# Patient Record
Sex: Male | Born: 1990
Health system: Southern US, Community
[De-identification: ages and names within clinical notes are randomized; demographics above are authoritative.]

## PROBLEM LIST (undated history)

## (undated) DIAGNOSIS — J302 Other seasonal allergic rhinitis: Secondary | ICD-10-CM

## (undated) DIAGNOSIS — E785 Hyperlipidemia, unspecified: Secondary | ICD-10-CM

## (undated) HISTORY — DX: Hyperlipidemia, unspecified: E78.5

---

## 2011-09-14 ENCOUNTER — Emergency Department (HOSPITAL_COMMUNITY): Payer: No Typology Code available for payment source

## 2011-09-14 ENCOUNTER — Emergency Department (HOSPITAL_COMMUNITY)
Admission: EM | Admit: 2011-09-14 | Discharge: 2011-09-14 | Disposition: A | Discharge status: Home / Self Care | Payer: No Typology Code available for payment source | Attending: Emergency Medicine | Admitting: Emergency Medicine

## 2011-09-14 ENCOUNTER — Encounter (HOSPITAL_COMMUNITY): Payer: Self-pay | Admitting: *Deleted

## 2011-09-14 DIAGNOSIS — Z880 Allergy status to penicillin: Secondary | ICD-10-CM | POA: Insufficient documentation

## 2011-09-14 DIAGNOSIS — M25519 Pain in unspecified shoulder: Secondary | ICD-10-CM | POA: Insufficient documentation

## 2011-09-14 DIAGNOSIS — S46912A Strain of unspecified muscle, fascia and tendon at shoulder and upper arm level, left arm, initial encounter: Secondary | ICD-10-CM
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
## 2011-09-17 NOTE — ED Provider Notes (Signed)
Medical screening examination/treatment/procedure(s) were performed by non-physician practitioner and as supervising physician I was immediately available for consultation/collaboration.  Flint Melter, MD 09/17/11 Paulo Fruit

## 2012-04-18 ENCOUNTER — Encounter (HOSPITAL_COMMUNITY): Payer: Self-pay | Admitting: Emergency Medicine

## 2012-04-18 ENCOUNTER — Emergency Department (HOSPITAL_COMMUNITY)
Admission: EM | Admit: 2012-04-18 | Discharge: 2012-04-18 | Disposition: A | Discharge status: Home / Self Care | Payer: BC Managed Care – PPO | Attending: Emergency Medicine | Admitting: Emergency Medicine

## 2012-04-18 DIAGNOSIS — R209 Unspecified disturbances of skin sensation: Secondary | ICD-10-CM | POA: Insufficient documentation

## 2012-04-18 DIAGNOSIS — R002 Palpitations: Secondary | ICD-10-CM

## 2012-04-18 DIAGNOSIS — M79609 Pain in unspecified limb: Secondary | ICD-10-CM | POA: Insufficient documentation

## 2012-04-18 DIAGNOSIS — M79603 Pain in arm, unspecified: Secondary | ICD-10-CM

## 2012-04-18 DIAGNOSIS — Z79899 Other long term (current) drug therapy: Secondary | ICD-10-CM | POA: Insufficient documentation

## 2012-04-18 DIAGNOSIS — R000 Tachycardia, unspecified: Secondary | ICD-10-CM | POA: Insufficient documentation

## 2012-04-18 HISTORY — DX: Other seasonal allergic rhinitis: J30.2

## 2012-04-18 NOTE — ED Provider Notes (Signed)
History     CSN: [id number]  Arrival date & time 04/18/12  4098   First MD Initiated Contact with Patient 04/18/12 [phone]      Chief Complaint  Patient presents with  . Tachycardia    tingling in arms    (Consider location/radiation/quality/duration/timing/severity/associated sxs/prior treatment) HPI Clayton Fry is a 22 y.o. male who presents to the Emergency Department complaining of palpitations and left arm numbness and aching. He was unable to sleep and had numbness to his left arm. After a while, he developed chest palpitations. He denies chest pain, shortness of breath, nausea, vomiting, diarrhea.He did not lose the ability to  Pick up items with his left hand.   PCP  Dr. Christell Constant Past Medical History  Diagnosis Date  . Seasonal allergies     History reviewed. No pertinent past surgical history.  No family history on file.  History  Substance Use Topics  . Smoking status: Never Smoker   . Smokeless tobacco: Not on file  . Alcohol Use: No      Review of Systems  Constitutional: Negative for fever.       10 Systems reviewed and are negative for acute change except as noted in the HPI.  HENT: Negative for congestion.   Eyes: Negative for discharge and redness.  Respiratory: Negative for cough and shortness of breath.   Cardiovascular: Positive for palpitations. Negative for chest pain.  Gastrointestinal: Negative for vomiting and abdominal pain.  Musculoskeletal: Negative for back pain.  Skin: Negative for rash.       Left arm numbness  Neurological: Negative for syncope, numbness and headaches.  Psychiatric/Behavioral:       No behavior change.    Allergies  Penicillins  Home Medications   Current Outpatient Rx  Name  Route  Sig  Dispense  Refill  . acetaminophen (TYLENOL) 500 MG tablet   Oral   Take 1,000 mg by mouth as needed.         . fexofenadine (ALLEGRA) 180 MG tablet   Oral   Take 180 mg by mouth every morning.           BP  125/73  Pulse 72  Temp(Src) 97.6 F (36.4 C) (Oral)  Resp 16  SpO2 100%  Physical Exam  Nursing note and vitals reviewed. Constitutional: He is oriented to person, place, and time.  Awake, alert, nontoxic appearance.  HENT:  Head: Atraumatic.  Eyes: Right eye exhibits no discharge. Left eye exhibits no discharge.  Neck: Neck supple.  Cardiovascular: Normal rate, normal heart sounds and intact distal pulses.   Pulmonary/Chest: Effort normal. He exhibits no tenderness.  Abdominal: Soft. There is no tenderness. There is no rebound.  Musculoskeletal: He exhibits no tenderness.  Baseline ROM, no obvious new focal weakness.Sensation to light touch intact.  Neurological: He is alert and oriented to person, place, and time. He has normal reflexes.  Mental status and motor strength appears baseline for patient and situation.  Skin: No rash noted.  Normal FROM of right hand. Able to grasp.   Psychiatric: He has a normal mood and affect.    ED Course  Procedures (including critical care time)    1. Palpitations   2. Arm pain       MDM  Patient with palpitations and left arm pain and numbness. PE normal. Heart rate is normal. FROM of left hand and arm. Sensation intact. Pt stable in ED with no significant deterioration in condition.The patient appears reasonably screened and/or  stabilized for discharge and I doubt any other medical condition or other [facility] requiring further screening, evaluation, or treatment in the ED at this time prior to discharge.  MDM Reviewed: nursing note and vitals           Nicoletta Dress. Colon Branch, MD 04/18/12 1610
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
## 2012-04-18 NOTE — ED Notes (Signed)
Pt states that he feels as though his heart is racing and there is tingling in his left arm. States this has been going on and off since Wednesday.  States "it doesn't last long, but today it's longer"

## 2014-01-13 ENCOUNTER — Telehealth: Payer: Self-pay | Admitting: Family Medicine

## 2014-01-13 NOTE — Telephone Encounter (Signed)
Pt has [employer] no current meds, he needs to PCP, [facility] advised to be here 15 mins early and to bring insurance card.

## 2014-03-22 ENCOUNTER — Encounter: Payer: Self-pay | Admitting: Family

## 2014-03-22 ENCOUNTER — Ambulatory Visit (INDEPENDENT_AMBULATORY_CARE_PROVIDER_SITE_OTHER): Payer: BLUE CROSS/BLUE SHIELD | Admitting: Family

## 2014-03-22 VITALS — BP 106/67 | HR 69 | Temp 97.9°F | Ht 74.0 in | Wt 191.0 lb

## 2014-03-22 DIAGNOSIS — Z23 Encounter for immunization: Secondary | ICD-10-CM

## 2014-03-22 DIAGNOSIS — J301 Allergic rhinitis due to pollen: Secondary | ICD-10-CM

## 2014-03-22 DIAGNOSIS — Z0000 Encounter for general adult medical examination without abnormal findings: Secondary | ICD-10-CM

## 2014-03-22 DIAGNOSIS — J309 Allergic rhinitis, unspecified: Secondary | ICD-10-CM | POA: Insufficient documentation

## 2014-03-22 NOTE — Progress Notes (Signed)
   Subjective:    Patient ID: Clayton Fry, male    DOB: 05-Aug-1990, 24 y.o.   MRN: [id number]  HPI Pt presents to the office for CPE. Pt currently only taking claritin 10 mg daily for allergic rhinitis. Pt states that is working well with no complaints at this time.    Review of Systems  Constitutional: Negative.   HENT: Negative.   Respiratory: Negative.   Cardiovascular: Negative.   Gastrointestinal: Negative.   Endocrine: Negative.   Genitourinary: Negative.   Musculoskeletal: Negative.   Neurological: Negative.   Hematological: Negative.   Psychiatric/Behavioral: Negative.   All other systems reviewed and are negative.      Objective:   Physical Exam  Constitutional: He is oriented to person, place, and time. He appears well-developed and well-nourished. No distress.  HENT:  Head: Normocephalic.  Right Ear: External ear normal.  Left Ear: External ear normal.  Mouth/Throat: Oropharynx is clear and moist.  Eyes: Pupils are equal, round, and reactive to light. Right eye exhibits no discharge. Left eye exhibits no discharge.  Neck: Normal range of motion. Neck supple. No thyromegaly present.  Cardiovascular: Normal rate, regular rhythm, normal heart sounds and intact distal pulses.   No murmur heard. Pulmonary/Chest: Effort normal and breath sounds normal. No respiratory distress. He has no wheezes.  Abdominal: Soft. Bowel sounds are normal. He exhibits no distension. There is no tenderness.  Musculoskeletal: Normal range of motion. He exhibits no edema or tenderness.  Neurological: He is alert and oriented to person, place, and time. He has normal reflexes. No cranial nerve deficit.  Skin: Skin is warm and dry. No rash noted. No erythema.  Psychiatric: He has a normal mood and affect. His behavior is normal. Judgment and thought content normal.  Vitals reviewed.     BP 106/67 mmHg  Pulse 69  Temp(Src) 97.9 F (36.6 C) (Oral)  Ht $R'6\' 2"'Sd$  (1.88 m)  Wt 191 lb  (86.637 kg)  BMI 24.51 kg/m2     Assessment & Plan:  1. Allergic rhinitis due to pollen  2. Annual physical exam - CMP14+EGFR - Lipid panel - Thyroid Panel With TSH - Vit D  25 hydroxy (rtn osteoporosis monitoring)   Continue all meds Labs pending Health Maintenance reviewed Diet and exercise encouraged RTO 1 year  Evelina Dun, FNP

## 2014-03-22 NOTE — Patient Instructions (Signed)

## 2014-03-22 NOTE — Addendum Note (Signed)
Addended by: Almeta MonasSTONE, JANIE M on: 03/22/2014 01:57 PM   Modules accepted: Orders

## 2014-03-23 ENCOUNTER — Other Ambulatory Visit: Payer: Self-pay | Admitting: Family

## 2014-03-23 DIAGNOSIS — E785 Hyperlipidemia, unspecified: Secondary | ICD-10-CM

## 2014-03-23 DIAGNOSIS — E559 Vitamin D deficiency, unspecified: Secondary | ICD-10-CM | POA: Insufficient documentation

## 2014-03-23 LAB — LIPID PANEL
CHOL/HDL RATIO: 3.3 ratio (ref 0.0–5.0)
Cholesterol, Total: 194 mg/dL (ref 100–199)
HDL: 58 mg/dL (ref >39)
LDL Calculated: 122 mg/dL — ABNORMAL HIGH (ref 0–99)
Triglycerides: 71 mg/dL (ref 0–149)
VLDL Cholesterol Cal: 14 mg/dL (ref 5–40)

## 2014-03-23 LAB — CMP14+EGFR
ALBUMIN: 4.8 g/dL (ref 3.5–5.5)
ALK PHOS: 56 IU/L (ref 39–117)
ALT: 9 IU/L (ref 0–44)
AST: 17 IU/L (ref 0–40)
Albumin/Globulin Ratio: 1.8 (ref 1.1–2.5)
BUN / CREAT RATIO: 11 (ref 8–19)
BUN: 13 mg/dL (ref 6–20)
CALCIUM: 10.1 mg/dL (ref 8.7–10.2)
CHLORIDE: 99 mmol/L (ref 97–108)
CO2: 27 mmol/L (ref 18–29)
Creatinine, Ser: 1.22 mg/dL (ref 0.76–1.27)
GFR calc non Af Amer: 82 mL/min/{1.73_m2} (ref >59)
GFR, EST AFRICAN AMERICAN: 95 mL/min/{1.73_m2} (ref >59)
GLOBULIN, TOTAL: 2.7 g/dL (ref 1.5–4.5)
GLUCOSE: 88 mg/dL (ref 65–99)
Potassium: 4.6 mmol/L (ref 3.5–5.2)
SODIUM: 140 mmol/L (ref 134–144)
Total Bilirubin: 0.5 mg/dL (ref 0.0–1.2)
Total Protein: 7.5 g/dL (ref 6.0–8.5)

## 2014-03-23 LAB — THYROID PANEL WITH TSH
FREE THYROXINE INDEX: 1.3 (ref 1.2–4.9)
T3 Uptake Ratio: 25 % (ref 24–39)
T4 TOTAL: 5.1 ug/dL (ref 4.5–12.0)
TSH: 3.01 u[IU]/mL (ref 0.450–4.500)

## 2014-03-23 LAB — VITAMIN D 25 HYDROXY (VIT D DEFICIENCY, FRACTURES): VIT D 25 HYDROXY: 12.3 ng/mL — AB (ref 30.0–100.0)

## 2014-03-23 MED ORDER — VITAMIN D (ERGOCALCIFEROL) 1.25 MG (50000 UNIT) PO CAPS
50000.0000 [IU] | ORAL_CAPSULE | ORAL | Status: DC
Start: 2014-03-23 — End: 2015-04-18

## 2014-03-23 MED ORDER — PRAVASTATIN SODIUM 20 MG PO TABS: 20.0000 mg | ORAL_TABLET | Freq: Every day | ORAL | Status: DC

## 2015-01-31 ENCOUNTER — Ambulatory Visit: Payer: BLUE CROSS/BLUE SHIELD | Admitting: Family

## 2015-02-17 ENCOUNTER — Encounter: Payer: Self-pay | Admitting: Family

## 2015-02-17 ENCOUNTER — Ambulatory Visit (INDEPENDENT_AMBULATORY_CARE_PROVIDER_SITE_OTHER): Payer: BLUE CROSS/BLUE SHIELD | Admitting: Family

## 2015-02-17 VITALS — BP 111/72 | HR 69 | Temp 97.0°F | Ht 74.0 in | Wt 185.6 lb

## 2015-02-17 DIAGNOSIS — E785 Hyperlipidemia, unspecified: Secondary | ICD-10-CM | POA: Diagnosis not present

## 2015-02-17 DIAGNOSIS — J301 Allergic rhinitis due to pollen: Secondary | ICD-10-CM | POA: Diagnosis not present

## 2015-02-17 DIAGNOSIS — Z23 Encounter for immunization: Secondary | ICD-10-CM

## 2015-02-17 DIAGNOSIS — E559 Vitamin D deficiency, unspecified: Secondary | ICD-10-CM

## 2015-02-17 NOTE — Patient Instructions (Signed)

## 2015-02-17 NOTE — Progress Notes (Signed)
   Subjective:    Patient ID: Clayton Fry, male    DOB: 12-26-1990, 24 y.o.   MRN: [id number]  PT presents to the office today for chronic follow up. Pt currently only taking claritin 10 mg daily for allergic rhinitis. Hyperlipidemia This is a chronic problem. The current episode started more than 1 month ago. The problem is uncontrolled. Recent lipid tests were reviewed and are high. He has no history of diabetes. Current antihyperlipidemic treatment includes statins. The current treatment provides moderate improvement of lipids. Risk factors for coronary artery disease include dyslipidemia, hypertension, male sex, a sedentary lifestyle and family history.      Review of Systems  Constitutional: Negative.   HENT: Negative.   Respiratory: Negative.   Cardiovascular: Negative.   Gastrointestinal: Negative.   Endocrine: Negative.   Genitourinary: Negative.   Musculoskeletal: Negative.   Neurological: Negative.   Hematological: Negative.   Psychiatric/Behavioral: Negative.   All other systems reviewed and are negative.      Objective:   Physical Exam  Constitutional: He is oriented to person, place, and time. He appears well-developed and well-nourished. No distress.  HENT:  Head: Normocephalic.  Right Ear: External ear normal.  Left Ear: External ear normal.  Nose: Nose normal.  Mouth/Throat: Oropharynx is clear and moist.  Eyes: Pupils are equal, round, and reactive to light. Right eye exhibits no discharge. Left eye exhibits no discharge.  Neck: Normal range of motion. Neck supple. No thyromegaly present.  Cardiovascular: Normal rate, regular rhythm, normal heart sounds and intact distal pulses.   No murmur heard. Pulmonary/Chest: Effort normal and breath sounds normal. No respiratory distress. He has no wheezes.  Abdominal: Soft. Bowel sounds are normal. He exhibits no distension. There is no tenderness.  Musculoskeletal: Normal range of motion. He exhibits no edema  or tenderness.  Neurological: He is alert and oriented to person, place, and time. He has normal reflexes. No cranial nerve deficit.  Skin: Skin is warm and dry. No rash noted. No erythema.  Psychiatric: He has a normal mood and affect. His behavior is normal. Judgment and thought content normal.  Vitals reviewed.     BP 111/72 mmHg  Pulse 69  Temp(Src) 97 F (36.1 C) (Oral)  Ht '6\' 2"'$  (1.88 m)  Wt 185 lb 9.6 oz (84.188 kg)  BMI 23.82 kg/m2     Assessment & Plan:  1. Hyperlipidemia - CMP14+EGFR - Lipid panel  2. Vitamin D deficiency - CMP14+EGFR - VITAMIN D 25 Hydroxy (Vit-D Deficiency, Fractures)  3. Allergic rhinitis due to pollen - CMP14+EGFR   Continue all meds Labs pending Health Maintenance reviewed Diet and exercise encouraged RTO 6 months  Evelina Dun, FNP

## 2015-02-18 ENCOUNTER — Encounter: Payer: Self-pay | Admitting: *Deleted

## 2015-02-18 LAB — CMP14+EGFR
ALK PHOS: 54 IU/L (ref 39–117)
ALT: 12 IU/L (ref 0–44)
AST: 19 IU/L (ref 0–40)
Albumin/Globulin Ratio: 1.6 (ref 1.1–2.5)
Albumin: 4.6 g/dL (ref 3.5–5.5)
BILIRUBIN TOTAL: 0.8 mg/dL (ref 0.0–1.2)
BUN/Creatinine Ratio: 8 (ref 8–19)
BUN: 9 mg/dL (ref 6–20)
CHLORIDE: 97 mmol/L (ref 96–106)
CO2: 27 mmol/L (ref 18–29)
CREATININE: 1.17 mg/dL (ref 0.76–1.27)
Calcium: 10.1 mg/dL (ref 8.7–10.2)
GFR calc non Af Amer: 86 mL/min/{1.73_m2} (ref >59)
GFR, EST AFRICAN AMERICAN: 100 mL/min/{1.73_m2} (ref >59)
GLUCOSE: 76 mg/dL (ref 65–99)
Globulin, Total: 2.8 g/dL (ref 1.5–4.5)
Potassium: 4 mmol/L (ref 3.5–5.2)
Sodium: 139 mmol/L (ref 134–144)
TOTAL PROTEIN: 7.4 g/dL (ref 6.0–8.5)

## 2015-02-18 LAB — LIPID PANEL
CHOLESTEROL TOTAL: 157 mg/dL (ref 100–199)
Chol/HDL Ratio: 2.5 ratio units (ref 0.0–5.0)
HDL: 64 mg/dL (ref >39)
LDL CALC: 83 mg/dL (ref 0–99)
Triglycerides: 48 mg/dL (ref 0–149)
VLDL CHOLESTEROL CAL: 10 mg/dL (ref 5–40)

## 2015-02-18 LAB — VITAMIN D 25 HYDROXY (VIT D DEFICIENCY, FRACTURES): Vit D, 25-Hydroxy: 56.4 ng/mL (ref 30.0–100.0)

## 2015-03-22 ENCOUNTER — Other Ambulatory Visit: Payer: Self-pay | Admitting: Family

## 2015-04-18 ENCOUNTER — Other Ambulatory Visit: Payer: Self-pay | Admitting: Family

## 2016-05-12 ENCOUNTER — Other Ambulatory Visit: Payer: Self-pay | Admitting: Family

## 2016-10-08 ENCOUNTER — Encounter (HOSPITAL_COMMUNITY): Payer: Self-pay

## 2016-10-08 DIAGNOSIS — Y9389 Activity, other specified: Secondary | ICD-10-CM | POA: Insufficient documentation

## 2016-10-08 DIAGNOSIS — Y929 Unspecified place or not applicable: Secondary | ICD-10-CM | POA: Insufficient documentation

## 2016-10-08 DIAGNOSIS — W208XXA Other cause of strike by thrown, projected or falling object, initial encounter: Secondary | ICD-10-CM | POA: Insufficient documentation

## 2016-10-08 DIAGNOSIS — S91011A Laceration without foreign body, right ankle, initial encounter: Secondary | ICD-10-CM | POA: Insufficient documentation

## 2016-10-08 DIAGNOSIS — Y990 Civilian activity done for income or pay: Secondary | ICD-10-CM | POA: Insufficient documentation

## 2016-10-08 DIAGNOSIS — Z79899 Other long term (current) drug therapy: Secondary | ICD-10-CM | POA: Insufficient documentation

## 2016-10-08 NOTE — ED Triage Notes (Addendum)
Laceration to top of right ankle after metal bucket falling ito ankle. UTD on tetanus shot. Bleeding controlled.

## 2016-10-09 ENCOUNTER — Emergency Department (HOSPITAL_COMMUNITY)
Admission: EM | Admit: 2016-10-09 | Discharge: 2016-10-09 | Disposition: A | Discharge status: Home / Self Care | Payer: Self-pay | Attending: Emergency Medicine | Admitting: Emergency Medicine

## 2016-10-09 DIAGNOSIS — S91011A Laceration without foreign body, right ankle, initial encounter: Secondary | ICD-10-CM

## 2016-10-09 MED ORDER — LIDOCAINE HCL (PF) 1 % IJ SOLN
INTRAMUSCULAR | Status: AC
  Filled 2016-10-09: qty 10

## 2016-10-09 NOTE — ED Notes (Signed)
Pt ambulatory to waiting room. Pt verbalized understanding of discharge instructions.   

## 2016-10-09 NOTE — ED Provider Notes (Signed)
[facility]-EMERGENCY DEPT Provider Note   CSN: [id number] Arrival date & time: 10/08/16  2157     History   Chief Complaint Chief Complaint  Patient presents with  . Laceration    HPI Clayton Fry is a 26 y.o. male.  Pt presents to the ED with c/o laceration to the right ankle. Pt states that while working, a bucket fell off the tractor  And injured the right ankle. Pt controlled bleeding by applying pressure. Tetanus up to date. No other injury.      Past Medical History:  Diagnosis Date  . Hyperlipidemia   . Seasonal allergies     Patient Active Problem List   Diagnosis Date Noted  . Hyperlipidemia 03/23/2014  . Vitamin D deficiency 03/23/2014  . Allergic rhinitis 03/22/2014    History reviewed. No pertinent surgical history.     Home Medications    Prior to Admission medications   Medication Sig Start Date End Date Taking? Authorizing Provider  acetaminophen (TYLENOL) 500 MG tablet Take 1,000 mg by mouth as needed.    [provider]  loratadine (CLARITIN) 10 MG tablet Take 10 mg by mouth daily.    [provider]  pravastatin (PRAVACHOL) 20 MG tablet TAKE 1 TABLET (20 MG TOTAL) BY MOUTH DAILY. 03/22/15   Junie SpencerHawks, Christy A, FNP  Vitamin D, Ergocalciferol, (DRISDOL) 50000 units CAPS capsule TAKE 1 CAPSULE (50,000 UNITS TOTAL) BY MOUTH EVERY 7 (SEVEN) DAYS. 05/14/16   Junie SpencerHawks, Christy A, FNP    Family History Family History  Problem Relation Age of Onset  . Hypertension Mother   . Hyperlipidemia Mother   . GER disease Mother     Social History Social History  Substance Use Topics  . Smoking status: Never Smoker  . Smokeless tobacco: Never Used  . Alcohol use No     Allergies   Penicillins   Review of Systems Review of Systems  Constitutional: Negative for activity change.       All ROS Neg except as noted in HPI  HENT: Negative for nosebleeds.   Eyes: Negative for photophobia and discharge.  Respiratory: Negative for cough,  shortness of breath and wheezing.   Cardiovascular: Negative for chest pain and palpitations.  Gastrointestinal: Negative for abdominal pain and blood in stool.  Genitourinary: Negative for dysuria, frequency and hematuria.  Musculoskeletal: Negative for arthralgias, back pain and neck pain.  Skin: Negative.   Neurological: Negative for dizziness, seizures and speech difficulty.  Psychiatric/Behavioral: Negative for confusion and hallucinations.     Physical Exam Updated Vital Signs BP 127/76 (BP Location: Right Arm)   Pulse 71   Temp 98.7 F (37.1 C) (Oral)   Resp 15   Ht 6\' 1"  (1.854 m)   Wt 86.2 kg (190 lb)   SpO2 100%   BMI 25.07 kg/m   Physical Exam  Constitutional: He is oriented to person, place, and time. He appears well-developed and well-nourished.  Non-toxic appearance.  HENT:  Head: Normocephalic.  Right Ear: Tympanic membrane and external ear normal.  Left Ear: Tympanic membrane and external ear normal.  Eyes: Pupils are equal, round, and reactive to light. EOM and lids are normal.  Neck: Normal range of motion. Neck supple. Carotid bruit is not present.  Cardiovascular: Normal rate, regular rhythm, normal heart sounds, intact distal pulses and normal pulses.   Pulmonary/Chest: Breath sounds normal. No respiratory distress.  Abdominal: Soft. Bowel sounds are normal. There is no tenderness. There is no guarding.  Musculoskeletal: Normal range of  motion.       Left ankle: He exhibits laceration. Achilles tendon normal.       Feet:  Lymphadenopathy:       Head (right side): No submandibular adenopathy present.       Head (left side): No submandibular adenopathy present.    He has no cervical adenopathy.  Neurological: He is alert and oriented to person, place, and time. He has normal strength. No cranial nerve deficit or sensory deficit.  Skin: Skin is warm and dry.  Psychiatric: He has a normal mood and affect. His speech is normal.  Nursing note and vitals  reviewed.    ED Treatments / Results  Labs (all labs ordered are listed, but only abnormal results are displayed) Labs Reviewed - No data to display  EKG  EKG Interpretation None       Radiology No results found.  Procedures .Marland KitchenLaceration Repair Date/Time: 10/09/2016 1:16 AM Performed by: Ivery Quale Authorized by: Ivery Quale   Consent:    Consent obtained:  Verbal   Consent given by:  Patient   Risks discussed:  Infection, pain, poor cosmetic result and poor wound healing Anesthesia (see MAR for exact dosages):    Anesthesia method:  Local infiltration   Local anesthetic:  Lidocaine 1% w/o epi Laceration details:    Location:  Leg   Leg location:  R lower leg   Length (cm):  2 Repair type:    Repair type:  Simple Pre-procedure details:    Preparation:  Patient was prepped and draped in usual sterile fashion Exploration:    Hemostasis achieved with:  Direct pressure   Wound exploration: wound explored through full range of motion and entire depth of wound probed and visualized     Wound extent: no nerve damage noted, no tendon damage noted and no vascular damage noted   Treatment:    Area cleansed with:  Betadine   Amount of cleaning:  Standard   Visualized foreign bodies/material removed: no   Skin repair:    Repair method:  Staples   Number of staples:  4 Approximation:    Approximation:  Close Post-procedure details:    Dressing:  Sterile dressing   Patient tolerance of procedure:  Tolerated well, no immediate complications   (including critical care time)  Medications Ordered in ED Medications  lidocaine (PF) (XYLOCAINE) 1 % injection (not administered)     Initial Impression / Assessment and Plan / ED Course  I have reviewed the triage vital signs and the nursing notes.  Pertinent labs & imaging results that were available during my care of the patient were reviewed by me and considered in my medical decision making (see chart for  details).       Final Clinical Impressions(s) / ED Diagnoses MDm No gross neuro or vascular deficits noted. Pt to have staples removed in 7 days. He will return sooner if any changes or problem.   Final diagnoses:  Laceration of right ankle, initial encounter    New Prescriptions New Prescriptions   No medications on file     Ivery Quale, Cordelia Poche 10/09/16 0121    Shon Baton, MD 10/16/16 [phone]

## 2016-10-09 NOTE — Discharge Instructions (Signed)
Please keep the wound clean and dry. Have staples removed in 7 days. See your MD or return to ED if any signs of infection.

## 2016-10-16 ENCOUNTER — Encounter: Payer: Self-pay | Admitting: Family

## 2016-10-16 ENCOUNTER — Ambulatory Visit (INDEPENDENT_AMBULATORY_CARE_PROVIDER_SITE_OTHER): Payer: Self-pay | Admitting: Family

## 2016-10-16 VITALS — BP 109/68 | HR 79 | Temp 97.7°F | Ht 73.0 in | Wt 201.0 lb

## 2016-10-16 DIAGNOSIS — Z5189 Encounter for other specified aftercare: Secondary | ICD-10-CM

## 2016-10-16 DIAGNOSIS — Z4802 Encounter for removal of sutures: Secondary | ICD-10-CM

## 2016-10-16 NOTE — Progress Notes (Signed)
   Subjective:    Patient ID: Clayton Fry, male    DOB: 1990/11/08, 26 y.o.   MRN: [id number]  HPI PT presents to the office today for removal of 4 staples on right foot. PT was seen in ED on 10/09/16 after a "tractor bucket" fell on his foot.   Pt denies any erythemas, drainage, or warmth. PT states there is a trace amount of swelling and tenderness, but this has greatly improved.    Review of Systems  Skin: Positive for wound.  All other systems reviewed and are negative.      Objective:   Physical Exam  Constitutional: He is oriented to person, place, and time. He appears well-developed and well-nourished. No distress.  HENT:  Head: Normocephalic.  Cardiovascular: Normal rate, regular rhythm, normal heart sounds and intact distal pulses.   No murmur heard. Pulmonary/Chest: Effort normal and breath sounds normal. No respiratory distress. He has no wheezes.  Abdominal: He exhibits no distension.  Musculoskeletal: Normal range of motion. He exhibits edema (trace in left foot). He exhibits no tenderness.  Neurological: He is alert and oriented to person, place, and time.  Skin: Skin is warm and dry. No rash noted. No erythema.  Well approximated, no erythemas discharge noted, 4 staples in place  Psychiatric: He has a normal mood and affect. His behavior is normal. Judgment and thought content normal.  Vitals reviewed.  Area cleaned and removed 4 staples. Pt tolerated well. Antibiotic ointment applied with band-aid.    BP 109/68   Pulse 79   Temp 97.7 F (36.5 C) (Oral)   Ht 6\' 1"  (1.854 m)   Wt 201 lb (91.2 kg)   BMI 26.52 kg/m        Assessment & Plan:  1. Encounter for staple removal  2. Encounter for wound re-check  Area looks great! Report any s/s of infection  Keep clean and dry  RTO prn     Jannifer Rodney, FNP

## 2016-10-16 NOTE — Patient Instructions (Signed)
Wound Closure Removal The staples, stitches, or skin adhesives that were used to close your skin have been removed. You will need to continue the care described here until the wound is completely healed and your health care provider confirms that wound care can be stopped. How do I care for my wound? How you care for your wound after the wound closure has been removed depends on the kind of wound closure you had. Stitches or Staples  Keep the wound site dry and clean. Do not soak it in water.  If skin adhesive strips were applied after the staples were removed, they will begin to peel off in a few days. Allow them to remain in place until they fall off on their own.  If you still have a bandage (dressing), change it at least once a day or as directed by your health care provider. If the dressing sticks, pour warm, sterile water over it until it loosens and can be removed without pulling apart the wound edges. Pat the area dry with a soft, clean towel. Do not rub the wound because that may cause bleeding.  Apply cream or ointment that stops the growth of bacteria (antibacterial cream or antibacterial ointment) only if your health care provider has directed you to do so.  Place a nonstick bandage over the wound to prevent the dressing from sticking.  Cover the nonstick bandage with a new dressing as directed by your health care provider.  If the bandage becomes wet or dirty or it develops a bad smell, change it as soon as possible.  Take medicines only as directed by your health care provider.  Adhesive Strips or Glue  Adhesive strips and glue peel off on their own.  Leave adhesive strips and glue in place until they fall off.  Are there any bathing restrictions once my wound closure is removed? Do not take baths, swim, or use a hot tub until your health care provider approves. How can I decrease the size of my scar? How your scar heals and the size of your scar depend on many factors,  such as your age, the type of scar you have, and genetic factors. The following may help decrease the size of your scar:  Sunscreen. Use sunscreen with a sun protection factor (SPF) of at least 15 when out in the sun. Reapply the sunscreen every two hours.  Friction massage. Once your wound is completely healed, you can gently massage the scarred area. This can decrease scar thickness.  When should I seek help? Seek help if:  You have a fever.  You have chills.  You have drainage, redness, swelling, or pain at your wound.  There is a bad smell coming from your wound.  Your wound edges open up or do not stay closed after the wound closure has been removed.  This information is not intended to replace advice given to you by your health care provider. Make sure you discuss any questions you have with your health care provider. Document Released: 01/26/2008 Document Revised: 07/27/2015 Document Reviewed: 06/30/2013 Elsevier Interactive Patient Education  2018 [employer]  

## 2017-04-18 ENCOUNTER — Other Ambulatory Visit: Payer: Self-pay | Admitting: Family

## 2017-04-18 NOTE — Telephone Encounter (Signed)
Last Vit D 02/17/15  56.4

## 2017-07-07 ENCOUNTER — Other Ambulatory Visit: Payer: Self-pay | Admitting: Family

## 2017-07-08 NOTE — Telephone Encounter (Signed)
Last Vit D 02/17/15   56.4

## 2017-10-09 ENCOUNTER — Other Ambulatory Visit: Payer: Self-pay | Admitting: Family

## 2017-10-10 NOTE — Telephone Encounter (Signed)
Need to be seen  Been a year

## 2017-10-11 NOTE — Telephone Encounter (Signed)
Lmtcb/ww 08/16

## 2017-10-21 ENCOUNTER — Ambulatory Visit: Payer: BLUE CROSS/BLUE SHIELD | Admitting: Family

## 2017-10-21 ENCOUNTER — Encounter: Payer: Self-pay | Admitting: Family

## 2017-10-21 VITALS — BP 111/74 | HR 67 | Temp 98.4°F | Ht 73.0 in | Wt 184.2 lb

## 2017-10-21 DIAGNOSIS — E559 Vitamin D deficiency, unspecified: Secondary | ICD-10-CM

## 2017-10-21 DIAGNOSIS — Z114 Encounter for screening for human immunodeficiency virus [HIV]: Secondary | ICD-10-CM | POA: Diagnosis not present

## 2017-10-21 DIAGNOSIS — Z0000 Encounter for general adult medical examination without abnormal findings: Secondary | ICD-10-CM

## 2017-10-21 DIAGNOSIS — J301 Allergic rhinitis due to pollen: Secondary | ICD-10-CM

## 2017-10-21 NOTE — Patient Instructions (Signed)

## 2017-10-21 NOTE — Progress Notes (Signed)
   Subjective:    Patient ID: Clayton Fry, male    DOB: 08/13/1990, 27 y.o.   MRN: [id number]  Chief Complaint  Patient presents with  . Medical Management of Chronic Issues     HPI PT presents to the office today for CPE. PT currently taking Claritin daily for allergies and states this is working well. Pt denies any headache, palpitations, SOB, or edema at this time.     Review of Systems  All other systems reviewed and are negative.      Objective:   Physical Exam  Constitutional: He is oriented to person, place, and time. He appears well-developed and well-nourished. No distress.  HENT:  Head: Normocephalic.  Right Ear: External ear normal.  Left Ear: External ear normal.  Mouth/Throat: Oropharynx is clear and moist.  Eyes: Pupils are equal, round, and reactive to light. Right eye exhibits no discharge. Left eye exhibits no discharge.  Neck: Normal range of motion. Neck supple. No thyromegaly present.  Cardiovascular: Normal rate, regular rhythm, normal heart sounds and intact distal pulses.  No murmur heard. Pulmonary/Chest: Effort normal and breath sounds normal. No respiratory distress. He has no wheezes.  Abdominal: Soft. Bowel sounds are normal. He exhibits no distension. There is no tenderness.  Musculoskeletal: Normal range of motion. He exhibits no edema or tenderness.  Neurological: He is alert and oriented to person, place, and time. He has normal reflexes. No cranial nerve deficit.  Skin: Skin is warm and dry. No rash noted. No erythema.  Psychiatric: He has a normal mood and affect. His behavior is normal. Judgment and thought content normal.  Vitals reviewed.     BP 111/74   Pulse 67   Temp 98.4 F (36.9 C) (Oral)   Ht 6' 1" (1.854 m)   Wt 184 lb 3.2 oz (83.6 kg)   BMI 24.30 kg/m      Assessment & Plan:  Clayton Fry comes in today with chief complaint of Medical Management of Chronic Issues   Diagnosis and orders addressed:  1.  Annual physical exam - CMP14+EGFR - CBC with Differential/Platelet - Lipid panel - TSH - VITAMIN D 25 Hydroxy (Vit-D Deficiency, Fractures) - HIV antibody  2. Vitamin D deficiency - CMP14+EGFR - CBC with Differential/Platelet - VITAMIN D 25 Hydroxy (Vit-D Deficiency, Fractures)  3. Allergic rhinitis due to pollen, unspecified seasonality - CMP14+EGFR - CBC with Differential/Platelet  4. Encounter for screening for HIV - CMP14+EGFR - CBC with Differential/Platelet - HIV antibody   Labs pending Health Maintenance reviewed Diet and exercise encouraged  Follow up plan: 1 year    Clayton Hawks, FNP  

## 2017-10-22 LAB — LIPID PANEL
Chol/HDL Ratio: 2.8 ratio (ref 0.0–5.0)
Cholesterol, Total: 189 mg/dL (ref 100–199)
HDL: 67 mg/dL (ref >39)
LDL Calculated: 114 mg/dL — ABNORMAL HIGH (ref 0–99)
TRIGLYCERIDES: 41 mg/dL (ref 0–149)
VLDL Cholesterol Cal: 8 mg/dL (ref 5–40)

## 2017-10-22 LAB — CMP14+EGFR
A/G RATIO: 1.5 (ref 1.2–2.2)
ALK PHOS: 51 IU/L (ref 39–117)
ALT: 11 IU/L (ref 0–44)
AST: 16 IU/L (ref 0–40)
Albumin: 4.5 g/dL (ref 3.5–5.5)
BILIRUBIN TOTAL: 0.8 mg/dL (ref 0.0–1.2)
BUN/Creatinine Ratio: 9 (ref 9–20)
BUN: 12 mg/dL (ref 6–20)
CHLORIDE: 99 mmol/L (ref 96–106)
CO2: 27 mmol/L (ref 20–29)
CREATININE: 1.32 mg/dL — AB (ref 0.76–1.27)
Calcium: 10 mg/dL (ref 8.7–10.2)
GFR calc Af Amer: 84 mL/min/{1.73_m2} (ref >59)
GFR calc non Af Amer: 73 mL/min/{1.73_m2} (ref >59)
Globulin, Total: 3.1 g/dL (ref 1.5–4.5)
Glucose: 78 mg/dL (ref 65–99)
POTASSIUM: 4.2 mmol/L (ref 3.5–5.2)
SODIUM: 142 mmol/L (ref 134–144)
Total Protein: 7.6 g/dL (ref 6.0–8.5)

## 2017-10-22 LAB — CBC WITH DIFFERENTIAL/PLATELET
Basophils Absolute: 0 10*3/uL (ref 0.0–0.2)
Basos: 1 %
EOS (ABSOLUTE): 0.1 10*3/uL (ref 0.0–0.4)
Eos: 3 %
Hematocrit: 44.1 % (ref 37.5–51.0)
Hemoglobin: 14.4 g/dL (ref 13.0–17.7)
Immature Grans (Abs): 0 10*3/uL (ref 0.0–0.1)
Immature Granulocytes: 0 %
Lymphocytes Absolute: 2 10*3/uL (ref 0.7–3.1)
Lymphs: 41 %
MCH: 28.2 pg (ref 26.6–33.0)
MCHC: 32.7 g/dL (ref 31.5–35.7)
MCV: 86 fL (ref 79–97)
MONOS ABS: 0.5 10*3/uL (ref 0.1–0.9)
Monocytes: 10 %
NEUTROS PCT: 45 %
Neutrophils Absolute: 2.3 10*3/uL (ref 1.4–7.0)
PLATELETS: 287 10*3/uL (ref 150–450)
RBC: 5.11 x10E6/uL (ref 4.14–5.80)
RDW: 13 % (ref 12.3–15.4)
WBC: 4.9 10*3/uL (ref 3.4–10.8)

## 2017-10-22 LAB — VITAMIN D 25 HYDROXY (VIT D DEFICIENCY, FRACTURES): VIT D 25 HYDROXY: 51.2 ng/mL (ref 30.0–100.0)

## 2017-10-22 LAB — TSH: TSH: 3.02 u[IU]/mL (ref 0.450–4.500)

## 2017-10-22 LAB — HIV ANTIBODY (ROUTINE TESTING W REFLEX): HIV SCREEN 4TH GENERATION: NONREACTIVE

## 2018-11-28 ENCOUNTER — Encounter: Payer: Self-pay | Admitting: Family

## 2018-11-28 ENCOUNTER — Other Ambulatory Visit: Payer: Self-pay

## 2018-11-28 ENCOUNTER — Ambulatory Visit (INDEPENDENT_AMBULATORY_CARE_PROVIDER_SITE_OTHER): Payer: BC Managed Care – PPO | Admitting: Family

## 2018-11-28 VITALS — BP 110/73 | HR 68 | Temp 98.0°F | Ht 73.0 in | Wt 182.6 lb

## 2018-11-28 DIAGNOSIS — Z23 Encounter for immunization: Secondary | ICD-10-CM | POA: Diagnosis not present

## 2018-11-28 DIAGNOSIS — Z0000 Encounter for general adult medical examination without abnormal findings: Secondary | ICD-10-CM

## 2018-11-28 DIAGNOSIS — Z0001 Encounter for general adult medical examination with abnormal findings: Secondary | ICD-10-CM | POA: Diagnosis not present

## 2018-11-28 DIAGNOSIS — J301 Allergic rhinitis due to pollen: Secondary | ICD-10-CM | POA: Diagnosis not present

## 2018-11-28 DIAGNOSIS — E559 Vitamin D deficiency, unspecified: Secondary | ICD-10-CM | POA: Diagnosis not present

## 2018-11-28 NOTE — Patient Instructions (Signed)

## 2018-11-28 NOTE — Progress Notes (Signed)
Subjective:    Patient ID: Clayton Fry, male    DOB: 21-Oct-1990, 28 y.o.   MRN: [id number]   Chief Complaint  Patient presents with  . Annual Exam   HPI  Pt presents to the office today for CPE. Currently only taking Claritin daily for allergies. Pt denies any headache, palpitations, SOB, or edema at this time. No complaints today.    Review of Systems  All other systems reviewed and are negative.  Family History  Problem Relation Age of Onset  . Hypertension Mother   . Hyperlipidemia Mother   . GER disease Mother    Social History   Socioeconomic History  . Marital status: Single    Spouse name: Not on file  . Number of children: Not on file  . Years of education: 24  . Highest education level: Not on file  Occupational History    Comment: [place]  . Financial resource strain: Not on file  . Food insecurity    Worry: Not on file    Inability: Not on file  . Transportation needs    Medical: Not on file    Non-medical: Not on file  Tobacco Use  . Smoking status: Never Smoker  . Smokeless tobacco: Never Used  Substance and Sexual Activity  . Alcohol use: No  . Drug use: No  . Sexual activity: Not Currently  Lifestyle  . Physical activity    Days per week: Not on file    Minutes per session: Not on file  . Stress: Not on file  Relationships  . Social Herbalist on phone: Not on file    Gets together: Not on file    Attends religious service: Not on file    Active member of club or organization: Not on file    Attends meetings of clubs or organizations: Not on file    Relationship status: Not on file  Other Topics Concern  . Not on file  Social History Narrative  . Not on file       Objective:   Physical Exam Vitals signs reviewed.  Constitutional:      General: He is not in acute distress.    Appearance: He is well-developed.  HENT:     Head: Normocephalic.     Right Ear: Tympanic membrane normal.     Left Ear:  Tympanic membrane normal.  Eyes:     General:        Right eye: No discharge.        Left eye: No discharge.     Pupils: Pupils are equal, round, and reactive to light.  Neck:     Musculoskeletal: Normal range of motion and neck supple.     Thyroid: No thyromegaly.  Cardiovascular:     Rate and Rhythm: Normal rate and regular rhythm.     Heart sounds: Normal heart sounds. No murmur.  Pulmonary:     Effort: Pulmonary effort is normal. No respiratory distress.     Breath sounds: Normal breath sounds. No wheezing.  Abdominal:     General: Bowel sounds are normal. There is no distension.     Palpations: Abdomen is soft.     Tenderness: There is no abdominal tenderness.  Musculoskeletal: Normal range of motion.        General: No tenderness.  Skin:    General: Skin is warm and dry.     Findings: No erythema or rash.  Neurological:  Mental Status: He is alert and oriented to person, place, and time.     Cranial Nerves: No cranial nerve deficit.     Deep Tendon Reflexes: Reflexes are normal and symmetric.  Psychiatric:        Behavior: Behavior normal.        Thought Content: Thought content normal.        Judgment: Judgment normal.       BP 110/73   Pulse 68   Temp 98 F (36.7 C) (Temporal)   Ht _0  (1.854 m)   Wt 182 lb 9.6 oz (82.8 kg)   SpO2 100%   BMI 24.09 kg/m      Assessment & Plan:  Clayton Fry comes in today with chief complaint of Annual Exam   Diagnosis and orders addressed:  1. Allergic rhinitis due to pollen, unspecified seasonality - CMP14+EGFR - CBC with Differential/Platelet  2. Vitamin D deficiency - CMP14+EGFR - CBC with Differential/Platelet - VITAMIN D 25 Hydroxy (Vit-D Deficiency, Fractures)  3. Annual physical exam - CMP14+EGFR - CBC with Differential/Platelet - TSH - VITAMIN D 25 Hydroxy (Vit-D Deficiency, Fractures)   Labs pending Health Maintenance reviewed Diet and exercise encouraged  Follow up plan: 1 year     Clayton Dun, FNP

## 2018-11-29 LAB — CMP14+EGFR
ALT: 10 IU/L (ref 0–44)
AST: 18 IU/L (ref 0–40)
Albumin/Globulin Ratio: 1.6 (ref 1.2–2.2)
Albumin: 4.6 g/dL (ref 4.1–5.2)
Alkaline Phosphatase: 51 IU/L (ref 39–117)
BUN/Creatinine Ratio: 11 (ref 9–20)
BUN: 14 mg/dL (ref 6–20)
Bilirubin Total: 0.9 mg/dL (ref 0.0–1.2)
CO2: 25 mmol/L (ref 20–29)
Calcium: 10 mg/dL (ref 8.7–10.2)
Chloride: 100 mmol/L (ref 96–106)
Creatinine, Ser: 1.29 mg/dL — ABNORMAL HIGH (ref 0.76–1.27)
GFR calc Af Amer: 86 mL/min/{1.73_m2} (ref >59)
GFR calc non Af Amer: 74 mL/min/{1.73_m2} (ref >59)
Globulin, Total: 2.8 g/dL (ref 1.5–4.5)
Glucose: 83 mg/dL (ref 65–99)
Potassium: 4.1 mmol/L (ref 3.5–5.2)
Sodium: 140 mmol/L (ref 134–144)
Total Protein: 7.4 g/dL (ref 6.0–8.5)

## 2018-11-29 LAB — CBC WITH DIFFERENTIAL/PLATELET
Basophils Absolute: 0 10*3/uL (ref 0.0–0.2)
Basos: 0 %
EOS (ABSOLUTE): 0.1 10*3/uL (ref 0.0–0.4)
Eos: 2 %
Hematocrit: 42.4 % (ref 37.5–51.0)
Hemoglobin: 14.1 g/dL (ref 13.0–17.7)
Immature Grans (Abs): 0 10*3/uL (ref 0.0–0.1)
Immature Granulocytes: 0 %
Lymphocytes Absolute: 1.7 10*3/uL (ref 0.7–3.1)
Lymphs: 38 %
MCH: 29.4 pg (ref 26.6–33.0)
MCHC: 33.3 g/dL (ref 31.5–35.7)
MCV: 89 fL (ref 79–97)
Monocytes Absolute: 0.5 10*3/uL (ref 0.1–0.9)
Monocytes: 12 %
Neutrophils Absolute: 2.2 10*3/uL (ref 1.4–7.0)
Neutrophils: 48 %
Platelets: 255 10*3/uL (ref 150–450)
RBC: 4.79 x10E6/uL (ref 4.14–5.80)
RDW: 12.6 % (ref 11.6–15.4)
WBC: 4.6 10*3/uL (ref 3.4–10.8)

## 2018-11-29 LAB — TSH: TSH: 3.37 u[IU]/mL (ref 0.450–4.500)

## 2018-11-29 LAB — VITAMIN D 25 HYDROXY (VIT D DEFICIENCY, FRACTURES): Vit D, 25-Hydroxy: 27.8 ng/mL — ABNORMAL LOW (ref 30.0–100.0)

## 2018-12-01 ENCOUNTER — Other Ambulatory Visit: Payer: Self-pay | Admitting: Family

## 2018-12-01 MED ORDER — VITAMIN D (ERGOCALCIFEROL) 1.25 MG (50000 UNIT) PO CAPS: 50000.0000 [IU] | ORAL_CAPSULE | ORAL | 3 refills | Status: DC

## 2019-05-27 DIAGNOSIS — Z23 Encounter for immunization: Secondary | ICD-10-CM | POA: Diagnosis not present

## 2019-07-07 DIAGNOSIS — Z23 Encounter for immunization: Secondary | ICD-10-CM | POA: Diagnosis not present

## 2019-10-06 ENCOUNTER — Telehealth: Payer: Self-pay | Admitting: Family

## 2019-10-06 ENCOUNTER — Encounter (HOSPITAL_COMMUNITY): Payer: Self-pay | Admitting: Emergency Medicine

## 2019-10-06 ENCOUNTER — Other Ambulatory Visit: Payer: Self-pay

## 2019-10-06 ENCOUNTER — Emergency Department (HOSPITAL_COMMUNITY): Payer: BC Managed Care – PPO

## 2019-10-06 ENCOUNTER — Emergency Department (HOSPITAL_COMMUNITY)
Admission: EM | Admit: 2019-10-06 | Discharge: 2019-10-06 | Disposition: A | Discharge status: Home / Self Care | Payer: BC Managed Care – PPO | Attending: Emergency Medicine | Admitting: Emergency Medicine

## 2019-10-06 DIAGNOSIS — R202 Paresthesia of skin: Secondary | ICD-10-CM | POA: Diagnosis not present

## 2019-10-06 DIAGNOSIS — R200 Anesthesia of skin: Secondary | ICD-10-CM

## 2019-10-06 DIAGNOSIS — R9431 Abnormal electrocardiogram [ECG] [EKG]: Secondary | ICD-10-CM | POA: Diagnosis not present

## 2019-10-06 LAB — URINALYSIS, ROUTINE W REFLEX MICROSCOPIC
Bilirubin Urine: NEGATIVE
Glucose, UA: NEGATIVE mg/dL
Hgb urine dipstick: NEGATIVE
Ketones, ur: NEGATIVE mg/dL
Leukocytes,Ua: NEGATIVE
Nitrite: NEGATIVE
Protein, ur: NEGATIVE mg/dL
Specific Gravity, Urine: 1.01 (ref 1.005–1.030)
pH: 7 (ref 5.0–8.0)

## 2019-10-06 LAB — BASIC METABOLIC PANEL
Anion gap: 9 (ref 5–15)
BUN: 12 mg/dL (ref 6–20)
CO2: 29 mmol/L (ref 22–32)
Calcium: 9.8 mg/dL (ref 8.9–10.3)
Chloride: 99 mmol/L (ref 98–111)
Creatinine, Ser: 1.14 mg/dL (ref 0.61–1.24)
GFR calc Af Amer: >60 mL/min (ref >60)
GFR calc non Af Amer: >60 mL/min (ref >60)
Glucose, Bld: 97 mg/dL (ref 70–99)
Potassium: 4.1 mmol/L (ref 3.5–5.1)
Sodium: 137 mmol/L (ref 135–145)

## 2019-10-06 LAB — CBC
HCT: 48.2 % (ref 39.0–52.0)
Hemoglobin: 15.4 g/dL (ref 13.0–17.0)
MCH: 28.9 pg (ref 26.0–34.0)
MCHC: 32 g/dL (ref 30.0–36.0)
MCV: 90.6 fL (ref 80.0–100.0)
Platelets: 220 10*3/uL (ref 150–400)
RBC: 5.32 MIL/uL (ref 4.22–5.81)
RDW: 12.9 % (ref 11.5–15.5)
WBC: 5.2 10*3/uL (ref 4.0–10.5)
nRBC: 0 % (ref 0.0–0.2)

## 2019-10-06 LAB — CBG MONITORING, ED: Glucose-Capillary: 89 mg/dL (ref 70–99)

## 2019-10-06 MED ORDER — SODIUM CHLORIDE 0.9% FLUSH
3.0000 mL | Freq: Once | INTRAVENOUS | Status: AC
  Administered 2019-10-06: 3 mL via INTRAVENOUS

## 2019-10-06 NOTE — ED Provider Notes (Signed)
[facility] EMERGENCY DEPARTMENT Provider Note   CSN: [id number] Arrival date & time: 10/06/19  3532     History Chief Complaint  Patient presents with  . Numbness    Clayton Fry is a 29 y.o. male.  He has no significant past medical history.  He is complaining of tingling left arm and left leg intermittently for the last 5 days.  He said he has had a prior to this but is never lasted this long.  Is it feels like they are asleep.  Comes and goes randomly.  May be some generalized fatigue.  No blurry vision double vision difficulty speaking or walking.  No focal weakness.  No headache.  No fevers or chills.  The history is provided by the patient.  Cerebrovascular Accident This is a new problem. The current episode started more than 2 days ago. The problem occurs daily. The problem has not changed since onset.Pertinent negatives include no chest pain, no abdominal pain, no headaches and no shortness of breath. Nothing aggravates the symptoms. Nothing relieves the symptoms. He has tried nothing for the symptoms. The treatment provided no relief.       Past Medical History:  Diagnosis Date  . Hyperlipidemia   . Seasonal allergies     Patient Active Problem List   Diagnosis Date Noted  . Vitamin D deficiency 03/23/2014  . Allergic rhinitis 03/22/2014    History reviewed. No pertinent surgical history.     Family History  Problem Relation Age of Onset  . Hypertension Mother   . Hyperlipidemia Mother   . GER disease Mother     Social History   Tobacco Use  . Smoking status: Never Smoker  . Smokeless tobacco: Never Used  Vaping Use  . Vaping Use: Never used  Substance Use Topics  . Alcohol use: No  . Drug use: No    Home Medications Prior to Admission medications   Medication Sig Start Date End Date Taking? Authorizing Provider  acetaminophen (TYLENOL) 500 MG tablet Take 1,000 mg by mouth as needed.    [provider]  loratadine (CLARITIN) 10 MG  tablet Take 10 mg by mouth daily.    [provider]  Vitamin D, Ergocalciferol, (DRISDOL) 1.25 MG (50000 UT) CAPS capsule Take 1 capsule (50,000 Units total) by mouth every 7 (seven) days. 12/01/18   Junie Spencer, FNP    Allergies    Penicillins  Review of Systems   Review of Systems  Constitutional: Negative for fever.  HENT: Negative for sore throat.   Eyes: Negative for visual disturbance.  Respiratory: Negative for shortness of breath.   Cardiovascular: Negative for chest pain.  Gastrointestinal: Negative for abdominal pain.  Genitourinary: Negative for dysuria.  Musculoskeletal: Negative for back pain and neck pain.  Skin: Negative for rash.  Neurological: Positive for numbness. Negative for speech difficulty, weakness and headaches.    Physical Exam Updated Vital Signs BP 129/79   Pulse 66   Temp 98.2 F (36.8 C)   Resp 18   Ht 6\' 1"  (1.854 m)   Wt 83.9 kg   SpO2 100%   BMI 24.41 kg/m   Physical Exam Vitals and nursing note reviewed.  Constitutional:      Appearance: Normal appearance. He is well-developed.  HENT:     Head: Normocephalic and atraumatic.  Eyes:     Conjunctiva/sclera: Conjunctivae normal.  Cardiovascular:     Rate and Rhythm: Normal rate and regular rhythm.     Heart  sounds: No murmur heard.   Pulmonary:     Effort: Pulmonary effort is normal. No respiratory distress.     Breath sounds: Normal breath sounds.  Abdominal:     Palpations: Abdomen is soft.     Tenderness: There is no abdominal tenderness.  Musculoskeletal:        General: No deformity or signs of injury. Normal range of motion.     Cervical back: Neck supple.  Skin:    General: Skin is warm and dry.     Capillary Refill: Capillary refill takes less than 2 seconds.  Neurological:     General: No focal deficit present.     Mental Status: He is alert and oriented to person, place, and time.     Cranial Nerves: No cranial nerve deficit.     Sensory: No sensory  deficit.     Motor: No weakness.     Gait: Gait normal.     Comments: Patient is awake and alert.  He has no sensory deficits at this time.  Normal strength upper and lower extremities.  Normal gait.     ED Results / Procedures / Treatments   Labs (all labs ordered are listed, but only abnormal results are displayed) Labs Reviewed  BASIC METABOLIC PANEL  CBC  URINALYSIS, ROUTINE W REFLEX MICROSCOPIC  CBG MONITORING, ED    EKG EKG Interpretation  Date/Time:  Tuesday October 06 2019 03:50:23 EDT Ventricular Rate:  69 PR Interval:  134 QRS Duration: 98 QT Interval:  354 QTC Calculation: 379 R Axis:   51 Text Interpretation: Normal sinus rhythm with sinus arrhythmia Incomplete right bundle branch block Borderline ECG No previous ECGs available Interpretation limited secondary to artifact Confirmed by Zadie Rhine ([id number]) on 10/06/2019 3:59:55 AM   Radiology CT Head Wo Contrast  Result Date: 10/06/2019 CLINICAL DATA:  Left arm and leg tingling EXAM: CT HEAD WITHOUT CONTRAST TECHNIQUE: Contiguous axial images were obtained from the base of the skull through the vertex without intravenous contrast. COMPARISON:  None. FINDINGS: Brain: There is no acute intracranial hemorrhage, mass effect, or edema. Gray-white differentiation is preserved. There is no extra-axial fluid collection. Ventricles and sulci are within normal limits in size and configuration. Vascular: No hyperdense vessel or unexpected calcification. Skull: Calvarium is unremarkable. Sinuses/Orbits: No acute finding. Other: None. IMPRESSION: No acute intracranial abnormality. Electronically Signed   By: Guadlupe Spanish M.D.   On: 10/06/2019 13:29    Procedures Procedures (including critical care time)  Medications Ordered in ED Medications  sodium chloride flush (NS) 0.9 % injection 3 mL (has no administration in time range)    ED Course  I have reviewed the triage vital signs and the nursing notes.  Pertinent labs  & imaging results that were available during my care of the patient were reviewed by me and considered in my medical decision making (see chart for details).    MDM Rules/Calculators/A&P                         This patient complains of tingling left arm left leg intermittent; this involves an extensive number of treatment Options and is a complaint that carries with it a high risk of complications and Morbidity. The differential includes stroke, seizure, MS, peripheral nerve, metabolic derangement, vitamin deficiency  I ordered, reviewed and interpreted labs, which included CBC shows a normal white count normal hemoglobin, normal indices, normal chemistries sugar and calcium. I ordered imaging studies which included CT  head and I independently    visualized and interpreted imaging which showed no acute findings Previous records obtained and reviewed in epic, no recent visits  After the interventions stated above, I reevaluated the patient and found patient to not have any current neurologic symptoms.  I reviewed his work-up with him.  Gave him information for outpatient neurology follow-up.  Return instructions discussed   Final Clinical Impression(s) / ED Diagnoses Final diagnoses:  Numbness and tingling of left arm and leg    Rx / DC Orders ED Discharge Orders    None       Terrilee Files, MD 10/06/19 1806

## 2019-10-06 NOTE — Discharge Instructions (Addendum)
You were seen in the emergency department for evaluation of intermittent numbness and tingling in your left arm and left leg.  You had lab work and a CAT scan of your head that did not show any serious findings.  Please follow-up with your primary care doctor and neurology.  Return to the emergency department for any worsening or concerning symptoms

## 2019-10-06 NOTE — ED Triage Notes (Signed)
Pt c/o left arm and leg numbness since Friday with some fatigue.

## 2019-10-06 NOTE — Telephone Encounter (Signed)
Scheduled patient for ER follow up with Jannifer Rodney on 10/12/2019.  Patient aware.

## 2019-10-12 ENCOUNTER — Encounter: Payer: Self-pay | Admitting: Family

## 2019-10-12 ENCOUNTER — Telehealth (INDEPENDENT_AMBULATORY_CARE_PROVIDER_SITE_OTHER): Payer: BC Managed Care – PPO | Admitting: Family

## 2019-10-12 DIAGNOSIS — R202 Paresthesia of skin: Secondary | ICD-10-CM

## 2019-10-12 DIAGNOSIS — Z09 Encounter for follow-up examination after completed treatment for conditions other than malignant neoplasm: Secondary | ICD-10-CM

## 2019-10-12 DIAGNOSIS — R200 Anesthesia of skin: Secondary | ICD-10-CM

## 2019-10-12 MED ORDER — BACLOFEN 10 MG PO TABS: 10.0000 mg | ORAL_TABLET | Freq: Three times a day (TID) | ORAL | 0 refills | Status: DC

## 2019-10-12 NOTE — Progress Notes (Signed)
° °  Virtual Visit via telephone Note Due to COVID-19 pandemic this visit was conducted virtually. This visit type was conducted due to national recommendations for restrictions regarding the COVID-19 Pandemic (e.g. social distancing, sheltering in place) in an effort to limit this patient's exposure and mitigate transmission in our community. All issues noted in this document were discussed and addressed.  A physical exam was not performed with this format.  I connected with Clayton Fry on 10/12/19 at 4:00 pm  by telephone and verified that I am speaking with the correct person using two identifiers. Clayton Fry is currently located at home and no ond is currently with him during visit. The provider, Jannifer Rodney, FNP is located in their office at time of visit.  I discussed the limitations, risks, security and privacy concerns of performing an evaluation and management service by telephone and the availability of in person appointments. I also discussed with the patient that there may be a patient responsible charge related to this service. The patient expressed understanding and agreed to proceed.   History and Present Illness:  HPI  Pt calls the office today for ED followed up. He went to the ED on 10/06/19 with left arm and left leg tingling.  He had a negative CT head and a normal BMP, CBC, and urine. He was discharged without any diagnoses.   He states he continues to have an intermittent tingling feeling on left leg and arm. He states this has improved. He states the tingling is mostly in his arm over the last few day.   States he only has it when he wakes up in the morning in his left arm.   Denies any pain, injury, blurred vision, double vision, changes in gait, or speech.   Review of Systems  Neurological: Positive for tingling.  All other systems reviewed and are negative.    Observations/Objective: No SOB or distress noted   Assessment and Plan: 1. Numbness  and tingling of left arm and leg  - baclofen (LIORESAL) 10 MG tablet; Take 1 tablet (10 mg total) by mouth 3 (three) times daily.  Dispense: 30 each; Refill: 0  2. Hospital discharge follow-up  Given only occurring when waking up could be MSK, will try baclofen If symptoms continue will do referral to Neurologists Hospital notes reviewed      I discussed the assessment and treatment plan with the patient. The patient was provided an opportunity to ask questions and all were answered. The patient agreed with the plan and demonstrated an understanding of the instructions.   The patient was advised to call back or seek an in-person evaluation if the symptoms worsen or if the condition fails to improve as anticipated.  The above assessment and management plan was discussed with the patient. The patient verbalized understanding of and has agreed to the management plan. Patient is aware to call the clinic if symptoms persist or worsen. Patient is aware when to return to the clinic for a follow-up visit. Patient educated on when it is appropriate to go to the emergency department.   Time call ended:  4:17 pm   I provided 17 minutes of non-face-to-face time during this encounter.    Jannifer Rodney, FNP

## 2019-11-11 ENCOUNTER — Other Ambulatory Visit: Payer: Self-pay | Admitting: Family

## 2020-01-29 ENCOUNTER — Other Ambulatory Visit: Payer: Self-pay | Admitting: Family

## 2020-12-21 IMAGING — CT CT HEAD W/O CM
3 series · 16 of 47 positions shown, 19 images · non-contrast
Comparison: None.

CLINICAL DATA: Left arm and leg tingling

EXAM:
CT HEAD WITHOUT CONTRAST
TECHNIQUE: Contiguous axial images were obtained from the base of the skull
through the vertex without intravenous contrast.

[Series 2: head w o · axial · 0.41mm/px · z∈[+49,+174]mm · 10 of 31 slices shown, 13 images]
[im 3/31  brain]
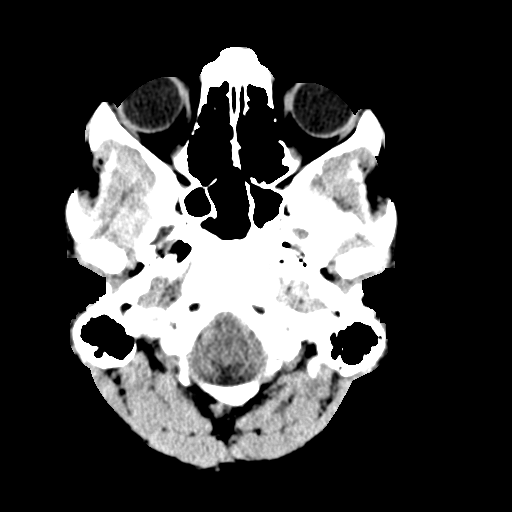
[im 3/31  bone]
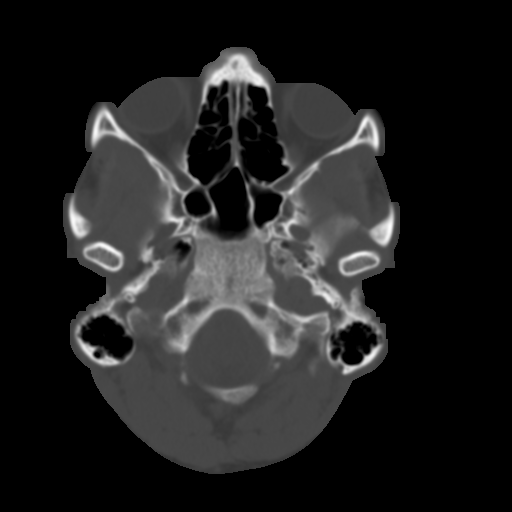
[im 6/31  brain]
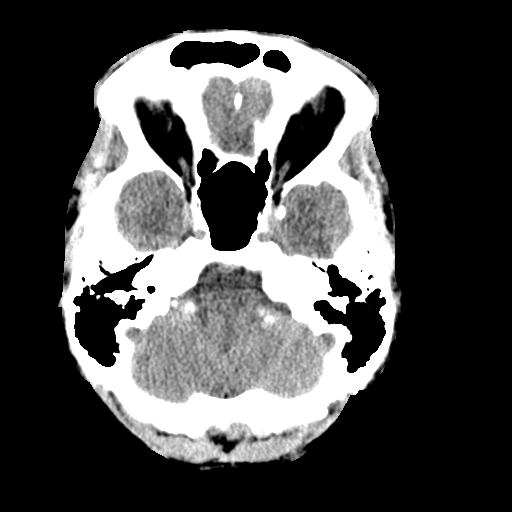
[im 9/31  brain]
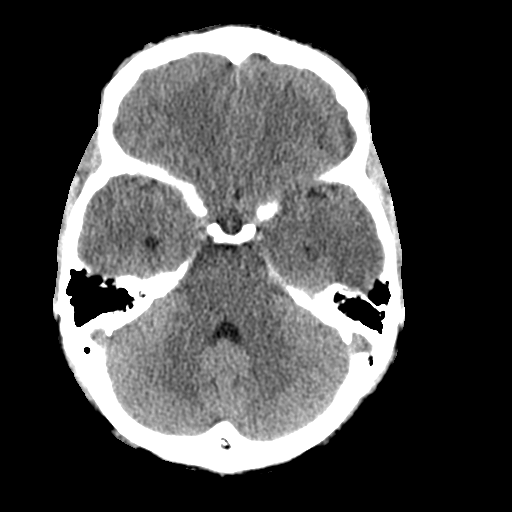
[im 11/31  brain]
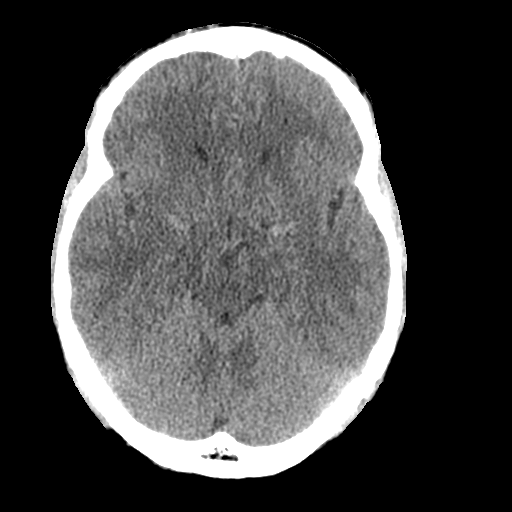
[im 14/31  brain]
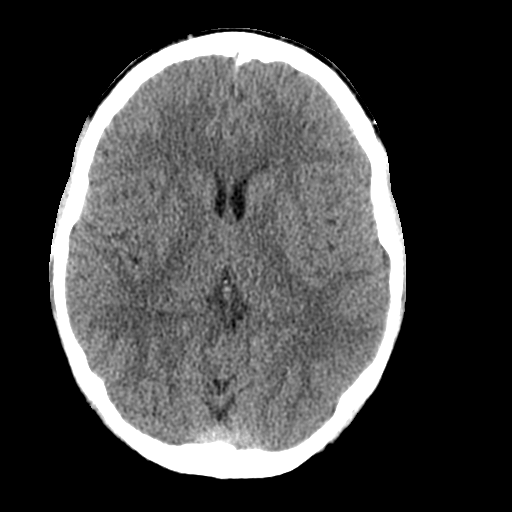
[im 14/31  bone]
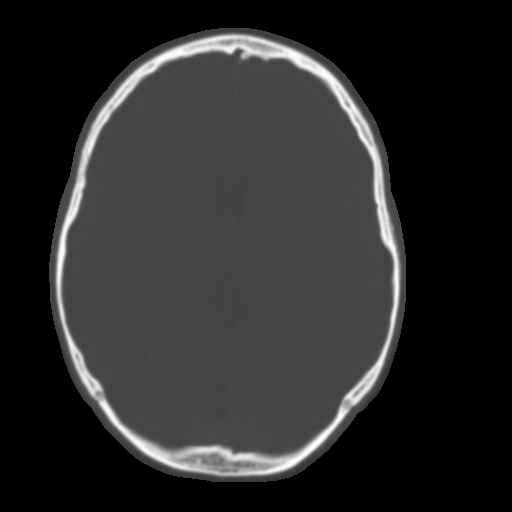
[im 17/31  brain]
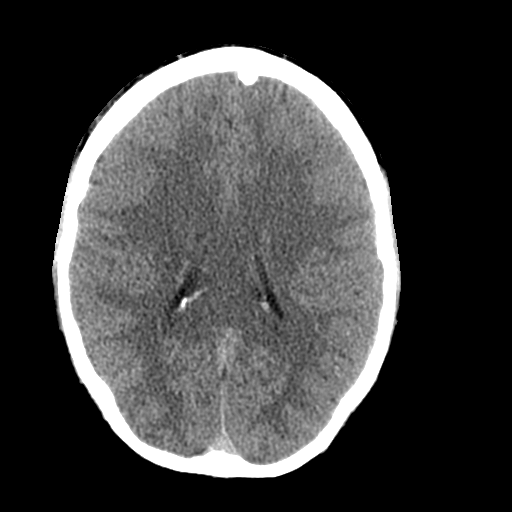
[im 20/31  brain]
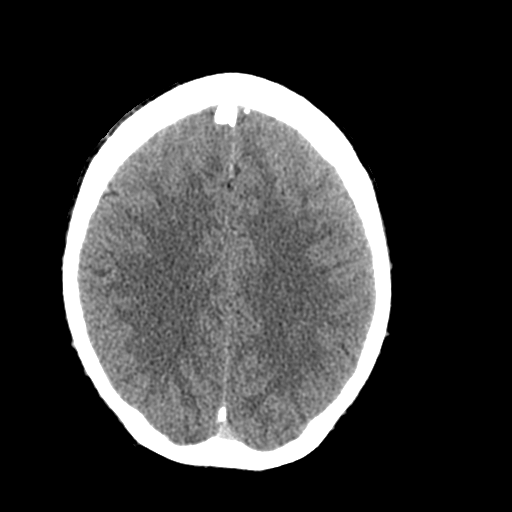
[im 23/31  brain]
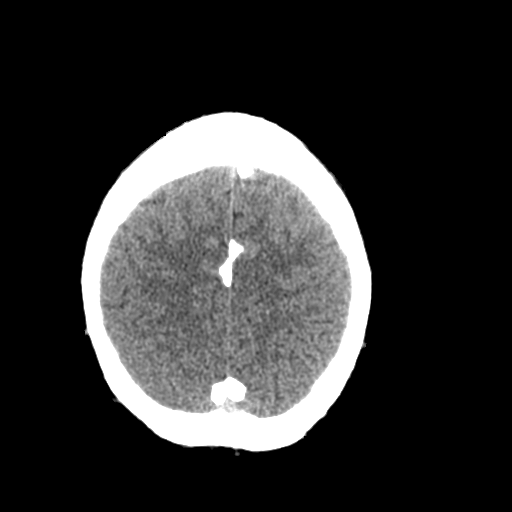
[im 25/31  brain]
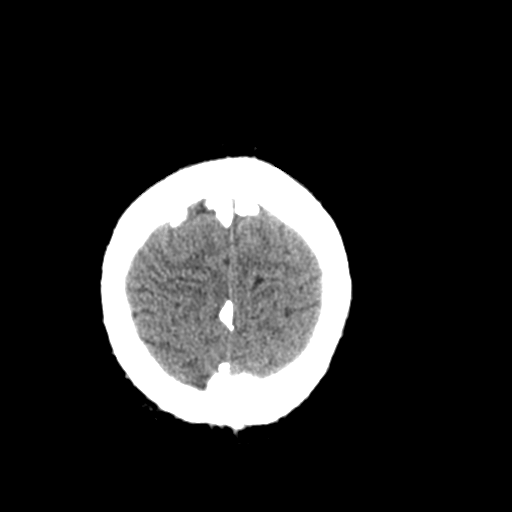
[im 25/31  bone]
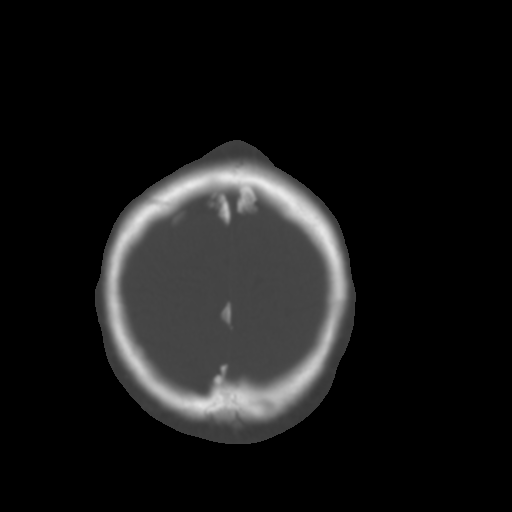
[im 28/31  brain]
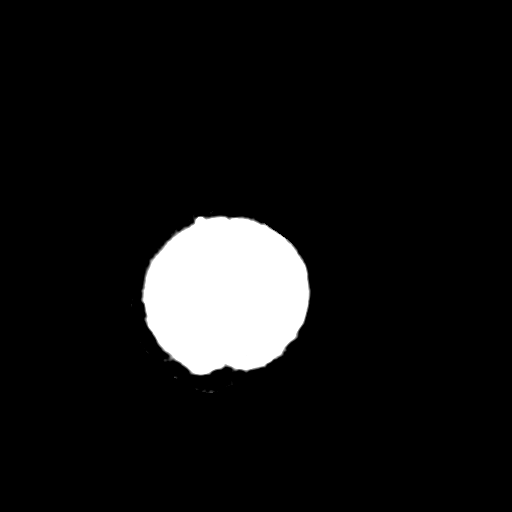

[Series 4: coronal soft · coronal · 0.34mm/px · 3 of 78 slices shown]
[im 26/78  brain]
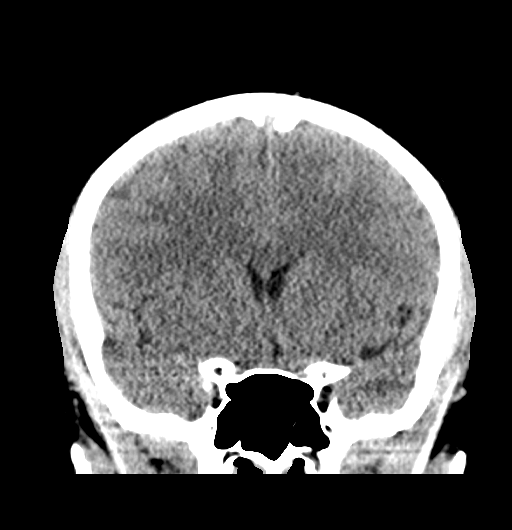
[im 35/78  brain]
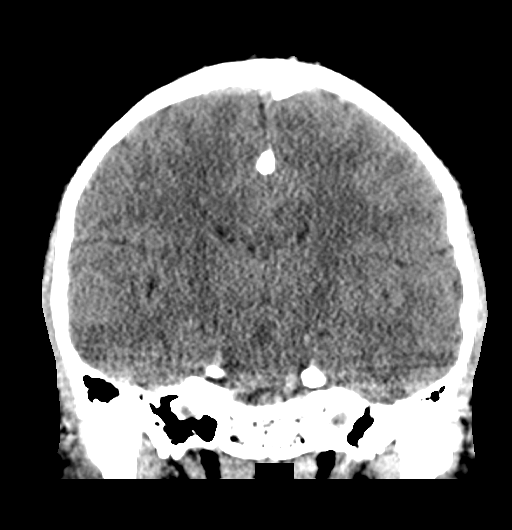
[im 43/78  brain]
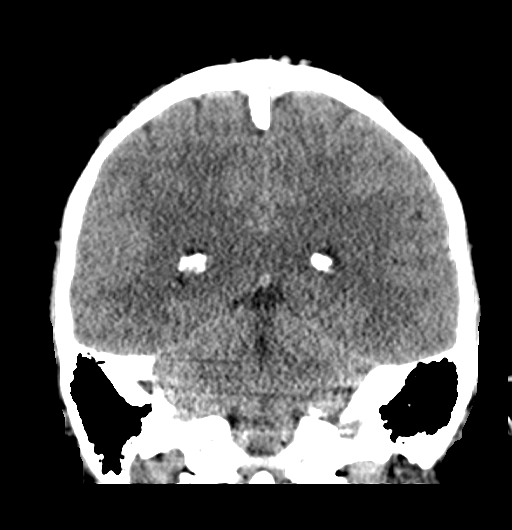

[Series 5: sagittal soft · sagittal · 0.34mm/px · 3 of 62 slices shown]
[im 21/62  brain]
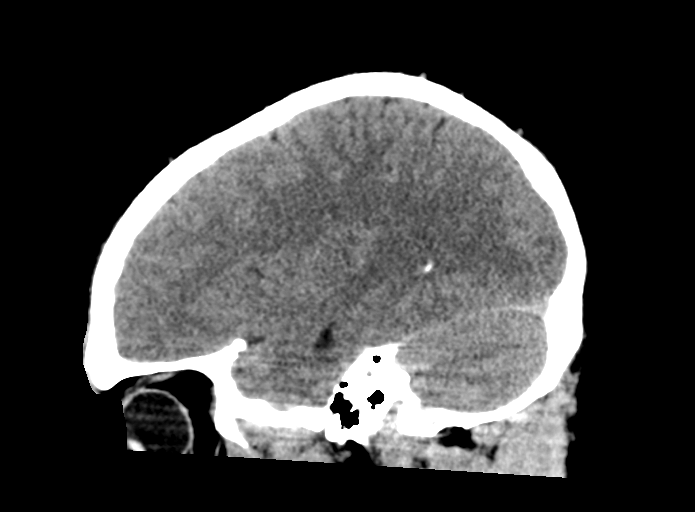
[im 31/62  brain]
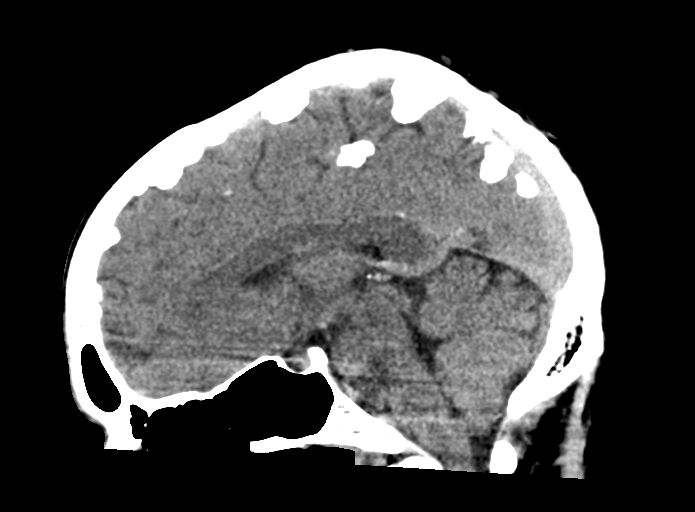
[im 41/62  brain]
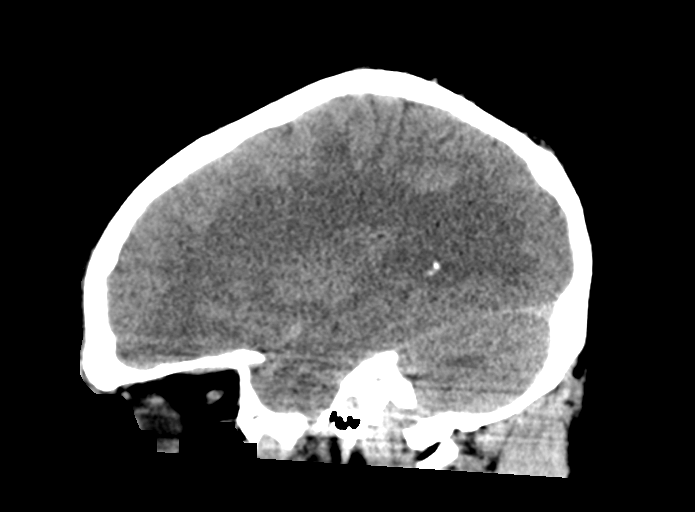

[16 of 47 positions shown; findings below may reference images not displayed]

FINDINGS: Brain: There is no acute intracranial hemorrhage, mass effect, or
edema. Gray-white differentiation is preserved. There is no
extra-axial fluid collection. Ventricles and sulci are within normal
limits in size and configuration.

Vascular: No hyperdense vessel or unexpected calcification.

Skull: Calvarium is unremarkable.

Sinuses/Orbits: No acute finding.

Other: None.
IMPRESSION: No acute intracranial abnormality.

## 2021-01-27 ENCOUNTER — Encounter: Payer: Self-pay | Admitting: Family

## 2021-01-27 ENCOUNTER — Ambulatory Visit: Payer: BC Managed Care – PPO | Admitting: Family

## 2021-01-27 VITALS — BP 107/73 | HR 69 | Temp 98.3°F | Ht 73.0 in | Wt 217.2 lb

## 2021-01-27 DIAGNOSIS — E559 Vitamin D deficiency, unspecified: Secondary | ICD-10-CM

## 2021-01-27 DIAGNOSIS — Z0000 Encounter for general adult medical examination without abnormal findings: Secondary | ICD-10-CM

## 2021-01-27 DIAGNOSIS — Z1159 Encounter for screening for other viral diseases: Secondary | ICD-10-CM

## 2021-01-27 DIAGNOSIS — Z136 Encounter for screening for cardiovascular disorders: Secondary | ICD-10-CM | POA: Diagnosis not present

## 2021-01-27 DIAGNOSIS — J301 Allergic rhinitis due to pollen: Secondary | ICD-10-CM

## 2021-01-27 DIAGNOSIS — Z0001 Encounter for general adult medical examination with abnormal findings: Secondary | ICD-10-CM

## 2021-01-27 NOTE — Patient Instructions (Signed)

## 2021-01-27 NOTE — Progress Notes (Signed)
Subjective:    Patient ID: Clayton Fry, male    DOB: [birth year], 30 y.o.   MRN: [id number]  Chief Complaint  Patient presents with   Medical Management of Chronic Issues    HPI PT presents to the office today for CPE. Pt currently only taking Claritin daily for allergies that are stable. Pt denies any headache, palpitations, SOB, or edema at this time.    Review of Systems  All other systems reviewed and are negative.  Family History  Problem Relation Age of Onset   Hypertension Mother    Hyperlipidemia Mother    GER disease Mother    Social History   Socioeconomic History   Marital status: Single    Spouse name: Not on file   Number of children: Not on file   Years of education: 12   Highest education level: Not on file  Occupational History    Comment: [employer]  Tobacco Use   Smoking status: Never   Smokeless tobacco: Never  Vaping Use   Vaping Use: Never used  Substance and Sexual Activity   Alcohol use: No   Drug use: No   Sexual activity: Not Currently  Other Topics Concern   Not on file  Social History Narrative   Not on file   Social Determinants of Health   Financial Resource Strain: Not on file  Food Insecurity: Not on file  Transportation Needs: Not on file  Physical Activity: Not on file  Stress: Not on file  Social Connections: Not on file       Objective:   Physical Exam Vitals reviewed.  Constitutional:      General: He is not in acute distress.    Appearance: He is well-developed.  HENT:     Head: Normocephalic.     Right Ear: Tympanic membrane normal.     Left Ear: Tympanic membrane normal.  Eyes:     General:        Right eye: No discharge.        Left eye: No discharge.     Pupils: Pupils are equal, round, and reactive to light.  Neck:     Thyroid: No thyromegaly.  Cardiovascular:     Rate and Rhythm: Normal rate and regular rhythm.     Heart sounds: Normal heart sounds. No murmur heard. Pulmonary:     Effort:  Pulmonary effort is normal. No respiratory distress.     Breath sounds: Normal breath sounds. No wheezing.  Abdominal:     General: Bowel sounds are normal. There is no distension.     Palpations: Abdomen is soft.     Tenderness: There is no abdominal tenderness.  Musculoskeletal:        General: No tenderness. Normal range of motion.     Cervical back: Normal range of motion and neck supple.  Skin:    General: Skin is warm and dry.     Findings: No erythema or rash.  Neurological:     Mental Status: He is alert and oriented to person, place, and time.     Cranial Nerves: No cranial nerve deficit.     Deep Tendon Reflexes: Reflexes are normal and symmetric.  Psychiatric:        Behavior: Behavior normal.        Thought Content: Thought content normal.        Judgment: Judgment normal.    BP 107/73   Pulse 69   Temp 98.3 F (36.8 C) (Temporal)  Ht 6' 1" (1.854 m)   Wt 217 lb 3.2 oz (98.5 kg)   BMI 28.66 kg/m       Assessment & Plan:  Clayton Fry comes in today with chief complaint of Medical Management of Chronic Issues   Diagnosis and orders addressed:  1. Annual physical exam - CMP14+EGFR - CBC with Differential/Platelet - Lipid panel - TSH - VITAMIN D 25 Hydroxy (Vit-D Deficiency, Fractures) - Hepatitis C antibody  2. Allergic rhinitis due to pollen, unspecified seasonality - CMP14+EGFR - CBC with Differential/Platelet  3. Vitamin D deficiency - CMP14+EGFR - CBC with Differential/Platelet - VITAMIN D 25 Hydroxy (Vit-D Deficiency, Fractures)  4. Need for hepatitis C screening test  - CMP14+EGFR - CBC with Differential/Platelet - Hepatitis C antibody   Labs pending Health Maintenance reviewed Diet and exercise encouraged  Follow up plan: 1 year   Clayton Hawks, FNP    

## 2021-01-28 LAB — CMP14+EGFR
ALT: 17 IU/L (ref 0–44)
AST: 22 IU/L (ref 0–40)
Albumin/Globulin Ratio: 1.5 (ref 1.2–2.2)
Albumin: 4.6 g/dL (ref 4.0–5.0)
Alkaline Phosphatase: 55 IU/L (ref 44–121)
BUN/Creatinine Ratio: 11 (ref 9–20)
BUN: 14 mg/dL (ref 6–20)
Bilirubin Total: 0.5 mg/dL (ref 0.0–1.2)
CO2: 27 mmol/L (ref 20–29)
Calcium: 9.8 mg/dL (ref 8.7–10.2)
Chloride: 97 mmol/L (ref 96–106)
Creatinine, Ser: 1.24 mg/dL (ref 0.76–1.27)
Globulin, Total: 3 g/dL (ref 1.5–4.5)
Glucose: 85 mg/dL (ref 70–99)
Potassium: 3.9 mmol/L (ref 3.5–5.2)
Sodium: 139 mmol/L (ref 134–144)
Total Protein: 7.6 g/dL (ref 6.0–8.5)
eGFR: 80 mL/min/{1.73_m2} (ref >59)

## 2021-01-28 LAB — CBC WITH DIFFERENTIAL/PLATELET
Basophils Absolute: 0 10*3/uL (ref 0.0–0.2)
Basos: 1 %
EOS (ABSOLUTE): 0.1 10*3/uL (ref 0.0–0.4)
Eos: 1 %
Hematocrit: 42.1 % (ref 37.5–51.0)
Hemoglobin: 13.9 g/dL (ref 13.0–17.7)
Immature Grans (Abs): 0 10*3/uL (ref 0.0–0.1)
Immature Granulocytes: 0 %
Lymphocytes Absolute: 2.3 10*3/uL (ref 0.7–3.1)
Lymphs: 37 %
MCH: 28.3 pg (ref 26.6–33.0)
MCHC: 33 g/dL (ref 31.5–35.7)
MCV: 86 fL (ref 79–97)
Monocytes Absolute: 0.6 10*3/uL (ref 0.1–0.9)
Monocytes: 9 %
Neutrophils Absolute: 3.4 10*3/uL (ref 1.4–7.0)
Neutrophils: 52 %
Platelets: 277 10*3/uL (ref 150–450)
RBC: 4.91 x10E6/uL (ref 4.14–5.80)
RDW: 12.3 % (ref 11.6–15.4)
WBC: 6.4 10*3/uL (ref 3.4–10.8)

## 2021-01-28 LAB — LIPID PANEL
Chol/HDL Ratio: 3.1 ratio (ref 0.0–5.0)
Cholesterol, Total: 207 mg/dL — ABNORMAL HIGH (ref 100–199)
HDL: 66 mg/dL (ref >39)
LDL Chol Calc (NIH): 133 mg/dL — ABNORMAL HIGH (ref 0–99)
Triglycerides: 45 mg/dL (ref 0–149)
VLDL Cholesterol Cal: 8 mg/dL (ref 5–40)

## 2021-01-28 LAB — HEPATITIS C ANTIBODY: Hep C Virus Ab: 0.2 s/co ratio (ref 0.0–0.9)

## 2021-01-28 LAB — VITAMIN D 25 HYDROXY (VIT D DEFICIENCY, FRACTURES): Vit D, 25-Hydroxy: 23.3 ng/mL — ABNORMAL LOW (ref 30.0–100.0)

## 2021-01-28 LAB — TSH: TSH: 1.53 u[IU]/mL (ref 0.450–4.500)

## 2021-01-30 ENCOUNTER — Other Ambulatory Visit: Payer: Self-pay | Admitting: Family

## 2021-01-30 MED ORDER — VITAMIN D (ERGOCALCIFEROL) 1.25 MG (50000 UNIT) PO CAPS
50000.0000 [IU] | ORAL_CAPSULE | ORAL | 3 refills | Status: AC
Start: 2021-01-30 — End: ?

## 2021-08-25 ENCOUNTER — Encounter: Payer: Self-pay | Admitting: Family

## 2021-08-25 ENCOUNTER — Telehealth: Payer: BC Managed Care – PPO | Admitting: Family

## 2021-08-25 DIAGNOSIS — J069 Acute upper respiratory infection, unspecified: Secondary | ICD-10-CM | POA: Diagnosis not present

## 2021-08-25 MED ORDER — CETIRIZINE HCL 10 MG PO TABS: 10.0000 mg | ORAL_TABLET | Freq: Every day | ORAL | 1 refills | Status: DC

## 2021-08-25 MED ORDER — FLUTICASONE PROPIONATE 50 MCG/ACT NA SUSP: 2.0000 | Freq: Every day | NASAL | 6 refills | Status: AC

## 2021-08-25 NOTE — Patient Instructions (Signed)

## 2021-08-25 NOTE — Progress Notes (Signed)
Virtual Visit Consent   Clayton Fry, you are scheduled for a virtual visit with a [facility] provider today. Just as with appointments in the office, your consent must be obtained to participate. Your consent will be active for this visit and any virtual visit you may have with one of our providers in the next 365 days. If you have a MyChart account, a copy of this consent can be sent to you electronically.  As this is a virtual visit, video technology does not allow for your provider to perform a traditional examination. This may limit your provider's ability to fully assess your condition. If your provider identifies any concerns that need to be evaluated in person or the need to arrange testing (such as labs, EKG, etc.), we will make arrangements to do so. Although advances in technology are sophisticated, we cannot ensure that it will always work on either your end or our end. If the connection with a video visit is poor, the visit may have to be switched to a telephone visit. With either a video or telephone visit, we are not always able to ensure that we have a secure connection.  By engaging in this virtual visit, you consent to the provision of healthcare and authorize for your insurance to be billed (if applicable) for the services provided during this visit. Depending on your insurance coverage, you may receive a charge related to this service.  I need to obtain your verbal consent now. Are you willing to proceed with your visit today? Clayton Fry has provided verbal consent on 08/25/2021 for a virtual visit (video or telephone). Jannifer Rodney, FNP  Date: 08/25/2021 11:53 AM  Virtual Visit via Video Note   I, Jannifer Rodney, connected with  Clayton Fry  ([id number], 25-Apr-1990) on 08/25/21 at  4:30 PM EDT by a video-enabled telemedicine application and verified that I am speaking with the correct person using two identifiers.  Location: Patient: Virtual Visit Location  Patient: work  Provider: Pharmacist, community:  Office   I discussed the limitations of evaluation and management by telemedicine and the availability of in person appointments. The patient expressed understanding and agreed to proceed.    History of Present Illness: Clayton Fry is a 31 y.o. who identifies as a male who was assigned male at birth, and is being seen today for URI symptoms that started last week.  HPI: Sinusitis This is a new problem. The current episode started 1 to 4 weeks ago. The problem has been gradually worsening since onset. There has been no fever. The pain is mild. Associated symptoms include congestion, coughing, sneezing and a sore throat (improved). Pertinent negatives include no ear pain, headaches, shortness of breath or sinus pressure. Past treatments include oral decongestants. The treatment provided mild relief.    Problems:  Patient Active Problem List   Diagnosis Date Noted   Vitamin D deficiency 03/23/2014   Allergic rhinitis 03/22/2014    Allergies:  Allergies  Allergen Reactions   Penicillins Rash and Other (See Comments)    REACTION: Oral irritation/rash   Medications:  Current Outpatient Medications:    cetirizine (ZYRTEC ALLERGY) 10 MG tablet, Take 1 tablet (10 mg total) by mouth daily., Disp: 90 tablet, Rfl: 1   fluticasone (FLONASE) 50 MCG/ACT nasal spray, Place 2 sprays into both nostrils daily., Disp: 16 g, Rfl: 6   loratadine (CLARITIN) 10 MG tablet, Take 10 mg by mouth daily., Disp: , Rfl:    Vitamin D,  Ergocalciferol, (DRISDOL) 1.25 MG (50000 UNIT) CAPS capsule, Take 1 capsule (50,000 Units total) by mouth every 7 (seven) days., Disp: 12 capsule, Rfl: 3  Observations/Objective: Patient is well-developed, well-nourished in no acute distress.  Resting comfortably  Head is normocephalic, atraumatic.  No labored breathing.  Speech is clear and coherent with logical content.  Patient is alert and oriented at baseline.     Assessment and Plan: 1. Viral URI - fluticasone (FLONASE) 50 MCG/ACT nasal spray; Place 2 sprays into both nostrils daily.  Dispense: 16 g; Refill: 6 - cetirizine (ZYRTEC ALLERGY) 10 MG tablet; Take 1 tablet (10 mg total) by mouth daily.  Dispense: 90 tablet; Refill: 1  - Take meds as prescribed - Use a cool mist humidifier  -Use saline nose sprays frequently -Force fluids -For any cough or congestion  Use plain Mucinex- regular strength or max strength is fine -For fever or aces or pains- take tylenol or ibuprofen. -Throat lozenges if help -New toothbrush in 3 days   Follow Up Instructions: I discussed the assessment and treatment plan with the patient. The patient was provided an opportunity to ask questions and all were answered. The patient agreed with the plan and demonstrated an understanding of the instructions.  A copy of instructions were sent to the patient via MyChart unless otherwise noted below.    The patient was advised to call back or seek an in-person evaluation if the symptoms worsen or if the condition fails to improve as anticipated.  Time:  I spent 10 minutes with the patient via telehealth technology discussing the above problems/concerns.    Jannifer Rodney, FNP

## 2021-11-27 ENCOUNTER — Ambulatory Visit: Payer: BC Managed Care – PPO | Admitting: Family

## 2021-11-27 ENCOUNTER — Encounter: Payer: Self-pay | Admitting: Family

## 2021-11-27 DIAGNOSIS — J019 Acute sinusitis, unspecified: Secondary | ICD-10-CM | POA: Diagnosis not present

## 2021-11-27 MED ORDER — DOXYCYCLINE HYCLATE 100 MG PO TABS: 100.0000 mg | ORAL_TABLET | Freq: Two times a day (BID) | ORAL | 0 refills | Status: DC

## 2021-11-27 NOTE — Progress Notes (Signed)
Virtual Visit  Note Due to COVID-19 pandemic this visit was conducted virtually. This visit type was conducted due to national recommendations for restrictions regarding the COVID-19 Pandemic (e.g. social distancing, sheltering in place) in an effort to limit this patient's exposure and mitigate transmission in our community. All issues noted in this document were discussed and addressed.  A physical exam was not performed with this format.  I connected with Clayton Fry on 11/27/21  by telephone and verified that I am speaking with the correct person using two identifiers. Clayton Fry is currently located at work and no one is currently with him  during visit. The provider, Evelina Dun, FNP is located in their office at time of visit.  I discussed the limitations, risks, security and privacy concerns of performing an evaluation and management service by telephone and the availability of in person appointments. I also discussed with the patient that there may be a patient responsible charge related to this service. The patient expressed understanding and agreed to proceed.  Clayton Fry, Clayton Fry are scheduled for a virtual visit with your provider today.    Just as we do with appointments in the office, we must obtain your consent to participate.  Your consent will be active for this visit and any virtual visit you may have with one of our providers in the next 365 days.    If you have a MyChart account, I can also send a copy of this consent to you electronically.  All virtual visits are billed to your insurance company just like a traditional visit in the office.  As this is a virtual visit, video technology does not allow for your provider to perform a traditional examination.  This may limit your provider's ability to fully assess your condition.  If your provider identifies any concerns that need to be evaluated in person or the need to arrange testing such as labs, EKG, etc, we will  make arrangements to do so.    Although advances in technology are sophisticated, we cannot ensure that it will always work on either your end or our end.  If the connection with a video visit is poor, we may have to switch to a telephone visit.  With either a video or telephone visit, we are not always able to ensure that we have a secure connection.   I need to obtain your verbal consent now.   Are you willing to proceed with your visit today?   Clayton Fry has provided verbal consent on 11/27/2021 for a virtual visit (video or telephone).   Evelina Dun, [place] 11/27/2021  1:24 PM    History and Present Illness:  Sinusitis This is a new problem. The current episode started 1 to 4 weeks ago. The problem has been waxing and waning since onset. There has been no fever. His pain is at a severity of 1/10. The pain is mild. Associated symptoms include congestion, coughing, headaches and sinus pressure. Pertinent negatives include no ear pain, hoarse voice, shortness of breath, sneezing or sore throat. Past treatments include oral decongestants (zyrtec and flonase). The treatment provided mild relief.      Review of Systems  HENT:  Positive for congestion and sinus pressure. Negative for ear pain, hoarse voice, sneezing and sore throat.   Respiratory:  Positive for cough. Negative for shortness of breath.   Neurological:  Positive for headaches.     Observations/Objective: No SOB or distress noted   Assessment and Plan: 1.  Acute sinusitis, recurrence not specified, unspecified location - Take meds as prescribed - Use a cool mist humidifier  -Use saline nose sprays frequently -Force fluids -For any cough or congestion  Use plain Mucinex- regular strength or max strength is fine -For fever or aces or pains- take tylenol or ibuprofen. -Throat lozenges if help Follow up if symptoms worsen or do not improve  - doxycycline (VIBRA-TABS) 100 MG tablet; Take 1 tablet (100 mg total) by  mouth 2 (two) times daily.  Dispense: 20 tablet; Refill: 0    I discussed the assessment and treatment plan with the patient. The patient was provided an opportunity to ask questions and all were answered. The patient agreed with the plan and demonstrated an understanding of the instructions.   The patient was advised to call back or seek an in-person evaluation if the symptoms worsen or if the condition fails to improve as anticipated.  The above assessment and management plan was discussed with the patient. The patient verbalized understanding of and has agreed to the management plan. Patient is aware to call the clinic if symptoms persist or worsen. Patient is aware when to return to the clinic for a follow-up visit. Patient educated on when it is appropriate to go to the emergency department.     I provided 12 minutes of  non face-to-face time during this encounter.    Jannifer Rodney, FNP

## 2022-02-10 ENCOUNTER — Other Ambulatory Visit: Payer: Self-pay | Admitting: Family

## 2022-02-16 ENCOUNTER — Other Ambulatory Visit: Payer: Self-pay | Admitting: Family

## 2022-02-16 DIAGNOSIS — J069 Acute upper respiratory infection, unspecified: Secondary | ICD-10-CM

## 2022-03-22 ENCOUNTER — Telehealth (INDEPENDENT_AMBULATORY_CARE_PROVIDER_SITE_OTHER): Payer: BC Managed Care – PPO | Admitting: Family Medicine

## 2022-03-22 ENCOUNTER — Encounter: Payer: Self-pay | Admitting: Family Medicine

## 2022-03-22 DIAGNOSIS — J069 Acute upper respiratory infection, unspecified: Secondary | ICD-10-CM | POA: Diagnosis not present

## 2022-03-22 LAB — VERITOR FLU A/B WAIVED
Influenza A: NEGATIVE
Influenza B: NEGATIVE

## 2022-03-22 NOTE — Progress Notes (Signed)
Virtual Visit  Note Due to COVID-19 pandemic this visit was conducted virtually. This visit type was conducted due to national recommendations for restrictions regarding the COVID-19 Pandemic (e.g. social distancing, sheltering in place) in an effort to limit this patient's exposure and mitigate transmission in our community. All issues noted in this document were discussed and addressed.  A physical exam was not performed with this format.  I connected with Clayton Fry on 03/22/22 at 9:16 by telephone and verified that I am speaking with the correct person using two identifiers. Clayton Fry is currently located at home and no one is currently with him during th visit. The provider, Gwenlyn Perking, FNP is located in their office at time of visit.  I discussed the limitations, risks, security and privacy concerns of performing an evaluation and management service by telephone and the availability of in person appointments. I also discussed with the patient that there may be a patient responsible charge related to this service. The patient expressed understanding and agreed to proceed.  Unable to connect via video. Visit with telephone audio only.   CC: URI  History and Present Illness:  URI  This is a new problem. The current episode started yesterday. The problem has been gradually improving. There has been no fever. Associated symptoms include coughing and headaches. Pertinent negatives include no abdominal pain, chest pain, congestion, diarrhea, dysuria, ear pain, joint pain, joint swelling, nausea, neck pain, plugged ear sensation, rhinorrhea, sinus pain, sneezing, sore throat, swollen glands, vomiting or wheezing. Associated symptoms comments: Myalgias, chills. He has tried decongestant and acetaminophen for the symptoms. The treatment provided moderate relief.      Review of Systems  HENT:  Negative for congestion, ear pain, rhinorrhea, sinus pain, sneezing and sore  throat.   Respiratory:  Positive for cough. Negative for wheezing.   Cardiovascular:  Negative for chest pain.  Gastrointestinal:  Negative for abdominal pain, diarrhea, nausea and vomiting.  Genitourinary:  Negative for dysuria.  Musculoskeletal:  Negative for joint pain and neck pain.  Neurological:  Positive for headaches.     Observations/Objective: Alert and oriented x 3. Able to speak in full sentences without difficulty.    Assessment and Plan: Clayton Fry was seen today for uri.  Diagnoses and all orders for this visit:  Viral URI Negative rapid flu. Covid pending, quarantine until results. Will notify patient of results and plan of care pending results. Discussed symptomatic care and return precautions.  -     Veritor Flu A/B Waived; Future -     Novel Coronavirus, NAA ([facility]); Future -     Novel Coronavirus, NAA ([facility]) -     Veritor Flu A/B Waived     Follow Up Instructions: As needed.     I discussed the assessment and treatment plan with the patient. The patient was provided an opportunity to ask questions and all were answered. The patient agreed with the plan and demonstrated an understanding of the instructions.   The patient was advised to call back or seek an in-person evaluation if the symptoms worsen or if the condition fails to improve as anticipated.  The above assessment and management plan was discussed with the patient. The patient verbalized understanding of and has agreed to the management plan. Patient is aware to call the clinic if symptoms persist or worsen. Patient is aware when to return to the clinic for a follow-up visit. Patient educated on when it is appropriate to go to the emergency  department.   Time call ended:  0927  I provided 11 minutes of  non face-to-face time during this encounter.    Gwenlyn Perking, FNP

## 2022-03-23 LAB — NOVEL CORONAVIRUS, NAA: SARS-CoV-2, NAA: NOT DETECTED

## 2022-03-28 ENCOUNTER — Telehealth: Payer: Self-pay | Admitting: Family

## 2022-03-28 DIAGNOSIS — J019 Acute sinusitis, unspecified: Secondary | ICD-10-CM

## 2022-03-28 DIAGNOSIS — J069 Acute upper respiratory infection, unspecified: Secondary | ICD-10-CM

## 2022-03-28 MED ORDER — DOXYCYCLINE HYCLATE 100 MG PO TABS: 100.0000 mg | ORAL_TABLET | Freq: Two times a day (BID) | ORAL | 0 refills | Status: DC

## 2022-03-28 NOTE — Telephone Encounter (Signed)
Aware and verbalizes understanding per dpr.  

## 2022-03-28 NOTE — Telephone Encounter (Signed)
  Incoming Patient Call  03/28/2022  What symptoms do you have? Coughing real bad and can't eat for coughing  How long have you been sick? Since last week  Have you been seen for this problem? Yes - 1-25 with Lilia Pro  If your provider decides to give you a prescription, which pharmacy would you like for it to be sent to? [facility]   Patient informed that this information will be sent to the clinical staff for review and that they should receive a follow up call.

## 2022-03-28 NOTE — Telephone Encounter (Signed)
I have sent in doxycyline. If symptoms do not improve, needs to follow up in office.

## 2022-04-03 ENCOUNTER — Ambulatory Visit (INDEPENDENT_AMBULATORY_CARE_PROVIDER_SITE_OTHER): Payer: BC Managed Care – PPO | Admitting: Family

## 2022-04-03 ENCOUNTER — Encounter: Payer: Self-pay | Admitting: Family

## 2022-04-03 VITALS — BP 119/71 | HR 69 | Temp 98.4°F | Ht 73.0 in | Wt 216.8 lb

## 2022-04-03 DIAGNOSIS — J209 Acute bronchitis, unspecified: Secondary | ICD-10-CM

## 2022-04-03 MED ORDER — BENZONATATE 200 MG PO CAPS: 200.0000 mg | ORAL_CAPSULE | Freq: Three times a day (TID) | ORAL | 1 refills | Status: DC | PRN

## 2022-04-03 MED ORDER — PREDNISONE 20 MG PO TABS: 40.0000 mg | ORAL_TABLET | Freq: Every day | ORAL | 0 refills | Status: AC

## 2022-04-03 NOTE — Patient Instructions (Signed)

## 2022-04-03 NOTE — Progress Notes (Signed)
Subjective:    Patient ID: Clayton Fry, male    DOB: 1991/01/21, 32 y.o.   MRN: [id number]  Chief Complaint  Patient presents with   Cough    SINCE 01/25, DELYSUM OTC    Pt presents to the office today for cough. He was seen in office on 03/22/22 and diagnosed with viral URI. He was negative for COVID and flu. He has been taking OTC delsym with mild relief.  Cough This is a new problem. The current episode started 1 to 4 weeks ago. The problem has been gradually improving. The problem occurs every few minutes. The cough is Non-productive. Associated symptoms include shortness of breath ("once in awhile"). Pertinent negatives include no chills, ear congestion, ear pain, fever, headaches, myalgias, nasal congestion or wheezing. He has tried rest and OTC cough suppressant for the symptoms. The treatment provided mild relief.      Review of Systems  Constitutional:  Negative for chills and fever.  HENT:  Negative for ear pain.   Respiratory:  Positive for cough and shortness of breath ("once in awhile"). Negative for wheezing.   Musculoskeletal:  Negative for myalgias.  Neurological:  Negative for headaches.  All other systems reviewed and are negative.      Objective:   Physical Exam Vitals reviewed.  Constitutional:      General: He is not in acute distress.    Appearance: He is well-developed.  HENT:     Head: Normocephalic.  Eyes:     General:        Right eye: No discharge.        Left eye: No discharge.     Pupils: Pupils are equal, round, and reactive to light.  Neck:     Thyroid: No thyromegaly.  Cardiovascular:     Rate and Rhythm: Normal rate and regular rhythm.     Heart sounds: Normal heart sounds. No murmur heard. Pulmonary:     Effort: Pulmonary effort is normal. No respiratory distress.     Breath sounds: Normal breath sounds. No wheezing.     Comments: Barking cough Abdominal:     General: Bowel sounds are normal. There is no distension.      Palpations: Abdomen is soft.     Tenderness: There is no abdominal tenderness.  Musculoskeletal:        General: No tenderness. Normal range of motion.     Cervical back: Normal range of motion and neck supple.  Skin:    General: Skin is warm and dry.     Findings: No erythema or rash.  Neurological:     Mental Status: He is alert and oriented to person, place, and time.     Cranial Nerves: No cranial nerve deficit.     Deep Tendon Reflexes: Reflexes are normal and symmetric.  Psychiatric:        Behavior: Behavior normal.        Thought Content: Thought content normal.        Judgment: Judgment normal.       BP 119/71   Pulse 69   Temp 98.4 F (36.9 C) (Temporal)   Ht 6\' 1"  (1.854 m)   Wt 216 lb 12.8 oz (98.3 kg)   SpO2 97%   BMI 28.60 kg/m      Assessment & Plan:  Clayton Fry comes in today with chief complaint of Cough (SINCE 01/25, delsym OTC )   Diagnosis and orders addressed:  1. Acute bronchitis, unspecified organism -  Take meds as prescribed - Use a cool mist humidifier  -Use saline nose sprays frequently -Force fluids -For any cough or congestion  Use plain Mucinex- regular strength or max strength is fine -For fever or aces or pains- take tylenol or ibuprofen. -Throat lozenges if help -New toothbrush in 3 days Follow up if symptoms worsen or do not improve  - benzonatate (TESSALON) 200 MG capsule; Take 1 capsule (200 mg total) by mouth 3 (three) times daily as needed.  Dispense: 30 capsule; Refill: 1 - predniSONE (DELTASONE) 20 MG tablet; Take 2 tablets (40 mg total) by mouth daily with breakfast for 5 days.  Dispense: 10 tablet; Refill: 0   Evelina Dun, FNP

## 2022-06-14 ENCOUNTER — Other Ambulatory Visit: Payer: Self-pay | Admitting: Family

## 2022-09-18 ENCOUNTER — Other Ambulatory Visit: Payer: Self-pay | Admitting: Family

## 2022-09-18 DIAGNOSIS — J069 Acute upper respiratory infection, unspecified: Secondary | ICD-10-CM

## 2022-12-16 ENCOUNTER — Other Ambulatory Visit: Payer: Self-pay | Admitting: Family

## 2022-12-16 DIAGNOSIS — J069 Acute upper respiratory infection, unspecified: Secondary | ICD-10-CM

## 2023-03-16 ENCOUNTER — Other Ambulatory Visit: Payer: Self-pay | Admitting: Family

## 2023-03-16 DIAGNOSIS — J069 Acute upper respiratory infection, unspecified: Secondary | ICD-10-CM

## 2023-06-17 ENCOUNTER — Other Ambulatory Visit: Payer: Self-pay | Admitting: Family

## 2023-06-17 DIAGNOSIS — J069 Acute upper respiratory infection, unspecified: Secondary | ICD-10-CM

## 2023-06-18 ENCOUNTER — Encounter: Payer: Self-pay | Admitting: Family

## 2023-06-18 NOTE — Telephone Encounter (Signed)
 Christy NTBS last OV 04/03/22 NO RF sent to pharmacy last OV greater than a year

## 2023-06-18 NOTE — Telephone Encounter (Signed)
 Letter sent.

## 2023-08-12 ENCOUNTER — Ambulatory Visit (INDEPENDENT_AMBULATORY_CARE_PROVIDER_SITE_OTHER): Admitting: Family

## 2023-08-12 ENCOUNTER — Encounter: Payer: Self-pay | Admitting: Family

## 2023-08-12 VITALS — BP 115/74 | HR 64 | Temp 97.7°F | Ht 73.0 in | Wt 212.2 lb

## 2023-08-12 DIAGNOSIS — Z0000 Encounter for general adult medical examination without abnormal findings: Secondary | ICD-10-CM

## 2023-08-12 DIAGNOSIS — L723 Sebaceous cyst: Secondary | ICD-10-CM

## 2023-08-12 DIAGNOSIS — Z0001 Encounter for general adult medical examination with abnormal findings: Secondary | ICD-10-CM | POA: Diagnosis not present

## 2023-08-12 DIAGNOSIS — J301 Allergic rhinitis due to pollen: Secondary | ICD-10-CM | POA: Diagnosis not present

## 2023-08-12 DIAGNOSIS — E559 Vitamin D deficiency, unspecified: Secondary | ICD-10-CM

## 2023-08-12 NOTE — Patient Instructions (Signed)
 Pocket of Fluid in the Skin (Epidermoid Cyst): What to Know  An epidermoid cyst is a small lump under your skin. The cyst contains a substance that is thick and oily. What are the causes? A blocked hair follicle. A hair curls and re-enters the skin instead of growing straight out of the skin. A blocked pore. Irritated skin. An injury to the skin. Certain conditions that are passed from parent to child. Human papillomavirus (HPV). This happens rarely when cysts occur on the bottom of the feet. Long-term sun damage to the skin. What increases the risk? Having acne. Being male. Having an injury to the skin. Being past puberty. Certain conditions that are passed down through family (genetic disorder). What are the signs or symptoms? The only sign of this type of cyst may be a small, painless lump under the skin. These cysts are usually painless, but they can get infected. Symptoms of infection may include: Redness. Inflammation. Tenderness. Warmth. Fever. A bad-smelling substance that drains from the cyst. Pus that drains from the cyst. How is this treated? If a cyst becomes inflamed, treatment may include: Opening and draining the cyst. Antibiotics. Shots of medicines called steroids that help lessen inflammation. Surgery to remove the cyst if it's large, painful, or could turn into cancer. Do not try to open or squeeze a cyst yourself. Follow these instructions at home: Medicines Take your medicines only as told. If you were given antibiotics, take them as told. Do not stop taking them even if you start to feel better. General instructions Keep the area around your cyst clean and dry. Wear loose, dry clothing. Avoid touching your cyst. Check your cyst every day for signs of infection. Check for: Redness, swelling, or pain. Fluid or blood. Warmth. Pus or a bad smell. Keep all follow-up visits to make sure there's no discomfort or infection. Contact a doctor if: You have  any signs of infection. Your cyst doesn't get better or gets worse. You get a cyst that looks different from other cysts you've had. You have a fever. You have redness that spreads from the cyst. This information is not intended to replace advice given to you by your health care provider. Make sure you discuss any questions you have with your health care provider. Document Revised: 09/28/2022 Document Reviewed: 09/28/2022 Elsevier Patient Education  2024 [employer].

## 2023-08-12 NOTE — Progress Notes (Signed)
 Subjective:    Patient ID: Clayton Fry, male    DOB: September 14, 1990, 33 y.o.   MRN: [id number]  Chief Complaint  Patient presents with   Annual Exam    HPI PT presents to the office today for CPE. Pt currently only taking zyrtec  10 mg  daily and flonase  as needed for allergies that are stable. Pt denies any headache, palpitations, SOB, or edema at this time.    Complaining of a cyst on right shoulder that he noticed several months. Denies any pain, but has become larger.   Review of Systems  All other systems reviewed and are negative.   Family History  Problem Relation Age of Onset   Hypertension Mother    Hyperlipidemia Mother    GER disease Mother    Social History   Socioeconomic History   Marital status: Single    Spouse name: Not on file   Number of children: Not on file   Years of education: 36   Highest education level: 12th grade  Occupational History    Comment: [employer]  Tobacco Use   Smoking status: Never   Smokeless tobacco: Never  Vaping Use   Vaping status: Never Used  Substance and Sexual Activity   Alcohol use: No   Drug use: No   Sexual activity: Not Currently  Other Topics Concern   Not on file  Social History Narrative   Not on file   Social Drivers of Health   Financial Resource Strain: Low Risk  (08/11/2023)   Overall Financial Resource Strain (CARDIA)    Difficulty of Paying Living Expenses: Not very hard  Food Insecurity: No Food Insecurity (08/11/2023)   Hunger Vital Sign    Worried About Running Out of Food in the Last Year: Never true    Ran Out of Food in the Last Year: Never true  Transportation Needs: No Transportation Needs (08/11/2023)   PRAPARE - Administrator, Civil Service (Medical): No    Lack of Transportation (Non-Medical): No  Physical Activity: Unknown (08/11/2023)   Exercise Vital Sign    Days of Exercise per Week: Patient declined    Minutes of Exercise per Session: Not on file  Stress: No Stress  Concern Present (08/11/2023)   [employer] of Occupational Health - Occupational Stress Questionnaire    Feeling of Stress: Not at all  Social Connections: Unknown (08/11/2023)   Social Connection and Isolation Panel    Frequency of Communication with Friends and Family: Patient declined    Frequency of Social Gatherings with Friends and Family: Patient declined    Attends Religious Services: Patient declined    Database administrator or Organizations: No    Attends Engineer, structural: Not on file    Marital Status: Never married       Objective:   Physical Exam Vitals reviewed.  Constitutional:      General: He is not in acute distress.    Appearance: He is well-developed.  HENT:     Head: Normocephalic.     Right Ear: Tympanic membrane normal.     Left Ear: Tympanic membrane normal.   Eyes:     General:        Right eye: No discharge.        Left eye: No discharge.     Pupils: Pupils are equal, round, and reactive to light.   Neck:     Thyroid : No thyromegaly.   Cardiovascular:     Rate  and Rhythm: Normal rate and regular rhythm.     Heart sounds: Normal heart sounds. No murmur heard. Pulmonary:     Effort: Pulmonary effort is normal. No respiratory distress.     Breath sounds: Normal breath sounds. No wheezing.     Comments: Barking cough Abdominal:     General: Bowel sounds are normal. There is no distension.     Palpations: Abdomen is soft.     Tenderness: There is no abdominal tenderness.   Musculoskeletal:        General: No tenderness. Normal range of motion.     Cervical back: Normal range of motion and neck supple.   Skin:    General: Skin is warm and dry.     Findings: No erythema or rash.         Comments: Epidermoid cyst on right shoulder approx 3X3cm, no redness, tenderness noted   Neurological:     Mental Status: He is alert and oriented to person, place, and time.     Cranial Nerves: No cranial nerve deficit.     Deep Tendon  Reflexes: Reflexes are normal and symmetric.   Psychiatric:        Behavior: Behavior normal.        Thought Content: Thought content normal.        Judgment: Judgment normal.    Verbal consent given by patient. Local anesthesia Lidocaine  2% with 2ml Betadine prep Small incision made Thick white discharge with sanguineous fluid Pressure Dressing applied Pt tolerated well   BP 115/74   Pulse 64   Temp 97.7 F (36.5 C) (Temporal)   Ht 6' 1 (1.854 m)   Wt 212 lb 3.2 oz (96.3 kg)   SpO2 99%   BMI 28.00 kg/m       Assessment & Plan:  Clayton Fry comes in today with chief complaint of Annual Exam   Diagnosis and orders addressed:  1. Annual physical exam (Primary) - CMP14+EGFR - CBC with Differential/Platelet - Lipid panel  2. Allergic rhinitis due to pollen, unspecified seasonality - CMP14+EGFR  3. Vitamin D  deficiency - CMP14+EGFR  4. Sebaceous cyst Keep pressure dressing on  Report any s/s of infection  - CMP14+EGFR - CBC with Differential/Platelet   Labs pending Continue current medications  Health Maintenance reviewed Diet and exercise encouraged  Follow up plan: 1 year   Tommas Fragmin, FNP

## 2023-08-13 ENCOUNTER — Ambulatory Visit: Payer: Self-pay | Admitting: Family

## 2023-08-13 LAB — CMP14+EGFR
ALT: 12 IU/L (ref 0–44)
AST: 14 IU/L (ref 0–40)
Albumin: 4.6 g/dL (ref 4.1–5.1)
Alkaline Phosphatase: 60 IU/L (ref 44–121)
BUN/Creatinine Ratio: 11 (ref 9–20)
BUN: 15 mg/dL (ref 6–20)
Bilirubin Total: 0.5 mg/dL (ref 0.0–1.2)
CO2: 24 mmol/L (ref 20–29)
Calcium: 9.9 mg/dL (ref 8.7–10.2)
Chloride: 101 mmol/L (ref 96–106)
Creatinine, Ser: 1.31 mg/dL — ABNORMAL HIGH (ref 0.76–1.27)
Globulin, Total: 2.7 g/dL (ref 1.5–4.5)
Glucose: 88 mg/dL (ref 70–99)
Potassium: 4.3 mmol/L (ref 3.5–5.2)
Sodium: 141 mmol/L (ref 134–144)
Total Protein: 7.3 g/dL (ref 6.0–8.5)
eGFR: 73 mL/min/{1.73_m2} (ref >59)

## 2023-08-13 LAB — LIPID PANEL
Chol/HDL Ratio: 3.2 ratio (ref 0.0–5.0)
Cholesterol, Total: 203 mg/dL — ABNORMAL HIGH (ref 100–199)
HDL: 63 mg/dL (ref >39)
LDL Chol Calc (NIH): 133 mg/dL — ABNORMAL HIGH (ref 0–99)
Triglycerides: 40 mg/dL (ref 0–149)
VLDL Cholesterol Cal: 7 mg/dL (ref 5–40)

## 2023-08-13 LAB — CBC WITH DIFFERENTIAL/PLATELET
Basophils Absolute: 0 10*3/uL (ref 0.0–0.2)
Basos: 1 %
EOS (ABSOLUTE): 0.1 10*3/uL (ref 0.0–0.4)
Eos: 2 %
Hematocrit: 45.6 % (ref 37.5–51.0)
Hemoglobin: 14.8 g/dL (ref 13.0–17.7)
Immature Grans (Abs): 0 10*3/uL (ref 0.0–0.1)
Immature Granulocytes: 0 %
Lymphocytes Absolute: 2.1 10*3/uL (ref 0.7–3.1)
Lymphs: 35 %
MCH: 28.6 pg (ref 26.6–33.0)
MCHC: 32.5 g/dL (ref 31.5–35.7)
MCV: 88 fL (ref 79–97)
Monocytes Absolute: 0.7 10*3/uL (ref 0.1–0.9)
Monocytes: 12 %
Neutrophils Absolute: 3.2 10*3/uL (ref 1.4–7.0)
Neutrophils: 50 %
Platelets: 277 10*3/uL (ref 150–450)
RBC: 5.17 x10E6/uL (ref 4.14–5.80)
RDW: 12.5 % (ref 11.6–15.4)
WBC: 6.2 10*3/uL (ref 3.4–10.8)

## 2024-08-13 ENCOUNTER — Encounter: Payer: Self-pay | Admitting: Family
